# Patient Record
Sex: Male | Born: 1945 | Race: White | Hispanic: No | Marital: Married | Smoking: Former smoker
Health system: Southern US, Community
[De-identification: ages and names within clinical notes are randomized; demographics above are authoritative.]

## PROBLEM LIST (undated history)

## (undated) DIAGNOSIS — N400 Enlarged prostate without lower urinary tract symptoms: Secondary | ICD-10-CM

## (undated) DIAGNOSIS — N200 Calculus of kidney: Secondary | ICD-10-CM

## (undated) DIAGNOSIS — J189 Pneumonia, unspecified organism: Secondary | ICD-10-CM

## (undated) DIAGNOSIS — S32000A Wedge compression fracture of unspecified lumbar vertebra, initial encounter for closed fracture: Secondary | ICD-10-CM

## (undated) DIAGNOSIS — E785 Hyperlipidemia, unspecified: Secondary | ICD-10-CM

## (undated) DIAGNOSIS — K429 Umbilical hernia without obstruction or gangrene: Secondary | ICD-10-CM

## (undated) DIAGNOSIS — I639 Cerebral infarction, unspecified: Secondary | ICD-10-CM

## (undated) HISTORY — DX: Wedge compression fracture of unspecified lumbar vertebra, initial encounter for closed fracture: S32.000A

## (undated) HISTORY — DX: Pneumonia, unspecified organism: J18.9

## (undated) HISTORY — PX: EYE SURGERY: SHX253

## (undated) HISTORY — DX: Cerebral infarction, unspecified: I63.9

---

## 2003-03-17 HISTORY — PX: BACK SURGERY: SHX140

## 2003-07-30 ENCOUNTER — Ambulatory Visit (HOSPITAL_COMMUNITY): Admission: RE | Admit: 2003-07-30 | Discharge: 2003-07-31 | Payer: Self-pay | Admitting: Specialist

## 2003-08-15 ENCOUNTER — Encounter: Admission: RE | Admit: 2003-08-15 | Discharge: 2003-08-15 | Payer: Self-pay | Admitting: Specialist

## 2005-03-22 IMAGING — CR DG CHEST 2V
2 series · 2 of 2 positions shown · non-contrast
Comparison: none

CLINICAL DATA: Right sided chest pain.
 TWO VIEW CHEST
 No prior studies.

[view not recorded (1 of 2)]
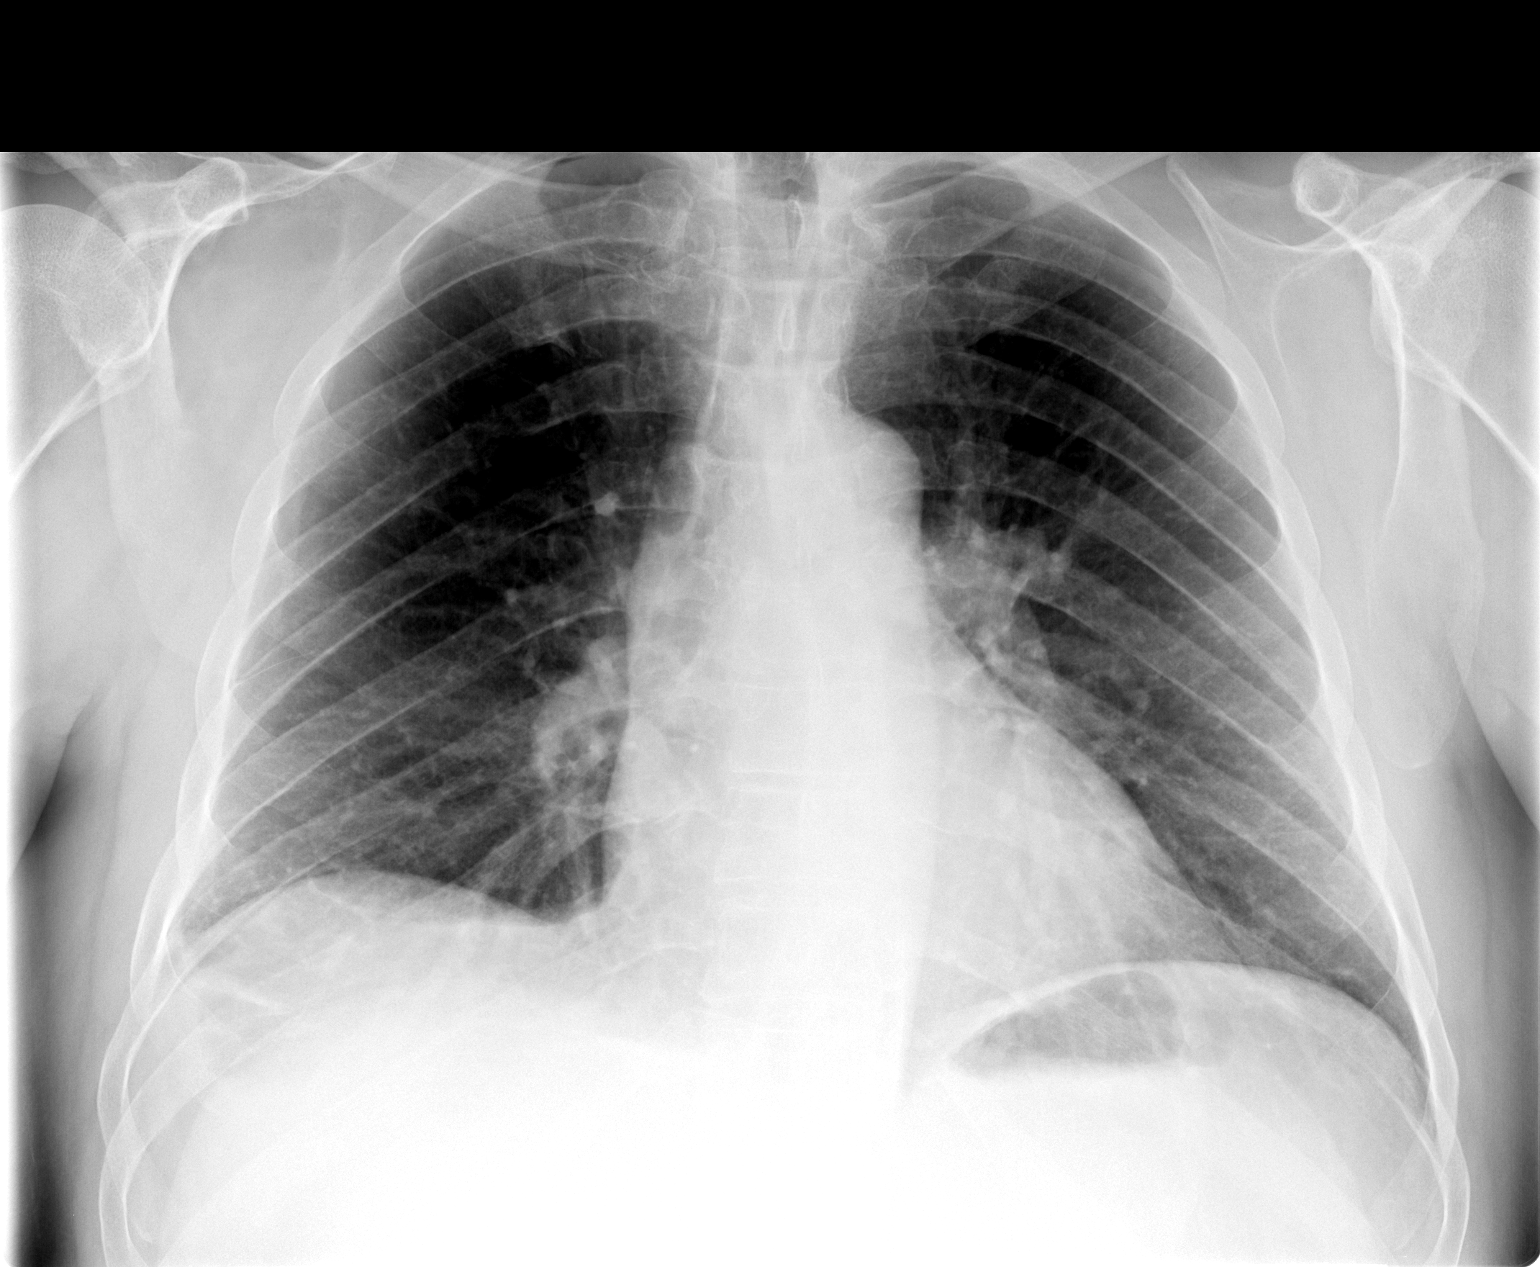

[view not recorded (2 of 2)]
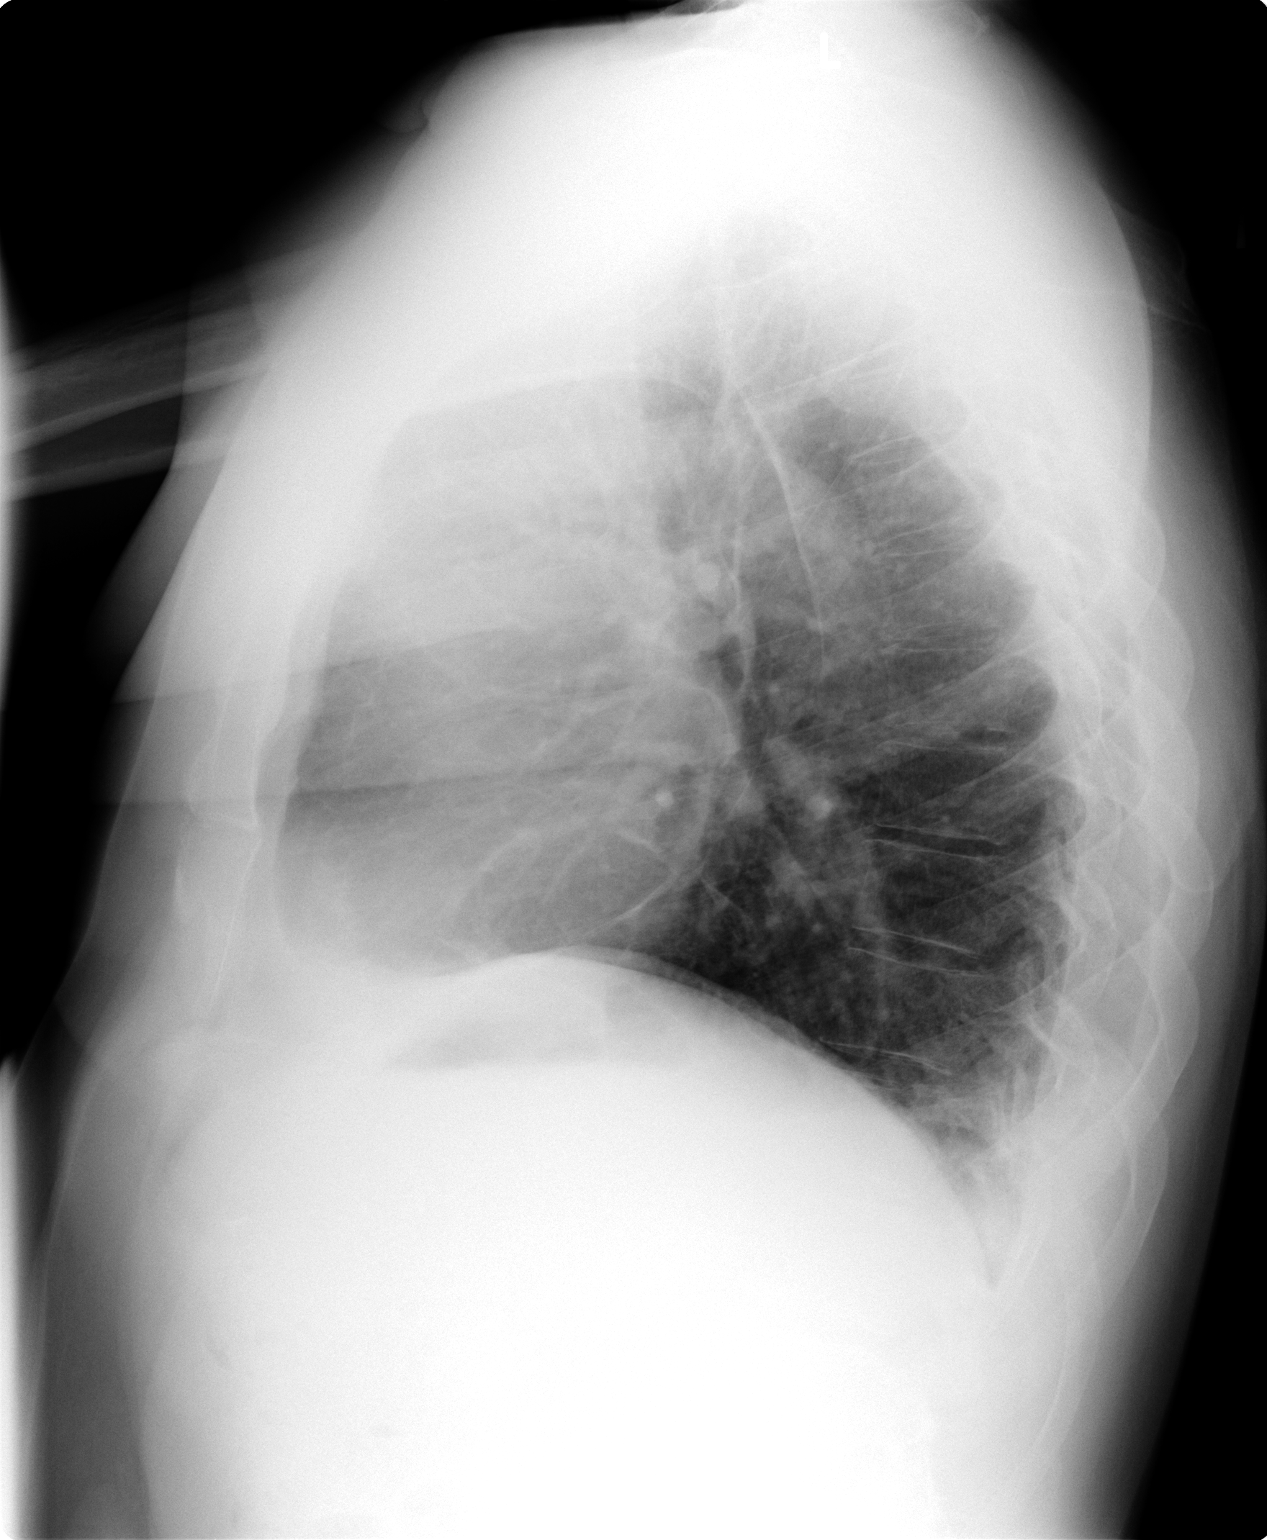

[2 of 2 positions shown; findings below may reference images not displayed]

There is some scarring versus atelectasis at the right lung base.  No edema, infiltrate or pleural effusion.  Heart size normal.  Soft tissue fold creates an additional line near the right lung apex which is not felt to represent pneumothorax.  The visualized bony thorax is unremarkable.  

 IMPRESSION

 Right basilar scarring versus atelectasis.  No active disease.

## 2012-04-14 ENCOUNTER — Encounter (HOSPITAL_COMMUNITY): Payer: Self-pay | Admitting: Pharmacy Technician

## 2012-04-14 ENCOUNTER — Encounter (HOSPITAL_COMMUNITY): Payer: Self-pay | Admitting: *Deleted

## 2012-04-14 NOTE — H&P (Signed)
  Troy Bentley is an 67 y.o. male.    Chief Complaint:  Left clavicle fracture  HPI: Pt is a 67 y.o. male complaining of left shoulder pain since a fall skiing 3 days ago. Diagnosed with clavicle fracture. Plan for ORIF and tightrope procedure for reduction  PCP:  No primary provider on file.  D/C Plans:Home   PMH: hyperlipidemia  PSH: No past surgical history on file.  Social History:  does not have a smoking history on file. He does not have any smokeless tobacco history on file. His alcohol and drug histories not on file.  Allergies:  NKDA  Medications: Pravastatin Aspirin vitamins  No results found for this or any previous visit (from the past 48 hour(s)). No results found.  ROS: Pain with rom of the left upper extremity  Physical Exam: Alert and oriented 67 y.o. male in no acute distress Cranial nerves 2-12 intact Cervical spine: full rom with no tenderness, nv intact distally Chest: active breath sounds bilaterally, no wheeze rhonchi or rales Heart: regular rate and rhythm, no murmur Abd: non tender non distended with active bowel sounds Hip is stable with rom  Left clavicle with obvious deformity distally nv intact distally No signs of open injury  Assessment/Plan Assessment: left clavicle fracture with displacement   Plan: Patient will undergo a left clavicle ORIF by Dr. Ranell Patrick at [facility]. Risks benefits and expectations were discussed with the patient. Patient understand risks, benefits and expectations and wishes to proceed.

## 2012-04-15 ENCOUNTER — Ambulatory Visit (HOSPITAL_COMMUNITY)
Admission: RE | Admit: 2012-04-15 | Discharge: 2012-04-15 | Disposition: A | Discharge status: Home / Self Care | Payer: Medicare Other | Source: Ambulatory Visit | Attending: Orthopedic Surgery | Admitting: Orthopedic Surgery

## 2012-04-15 ENCOUNTER — Encounter (HOSPITAL_COMMUNITY): Payer: Self-pay | Admitting: Anesthesiology

## 2012-04-15 ENCOUNTER — Ambulatory Visit (HOSPITAL_COMMUNITY): Payer: Medicare Other | Admitting: Anesthesiology

## 2012-04-15 ENCOUNTER — Ambulatory Visit (HOSPITAL_COMMUNITY): Payer: Medicare Other

## 2012-04-15 ENCOUNTER — Encounter (HOSPITAL_COMMUNITY)
Admission: RE | Disposition: A | Discharge status: Home / Self Care | Payer: Self-pay | Source: Ambulatory Visit | Attending: Orthopedic Surgery

## 2012-04-15 DIAGNOSIS — Y9323 Activity, snow (alpine) (downhill) skiing, snow boarding, sledding, tobogganing and snow tubing: Secondary | ICD-10-CM | POA: Insufficient documentation

## 2012-04-15 DIAGNOSIS — S42033A Displaced fracture of lateral end of unspecified clavicle, initial encounter for closed fracture: Secondary | ICD-10-CM | POA: Diagnosis present

## 2012-04-15 HISTORY — PX: ORIF CLAVICULAR FRACTURE: SHX5055

## 2012-04-15 HISTORY — DX: Benign prostatic hyperplasia without lower urinary tract symptoms: N40.0

## 2012-04-15 HISTORY — DX: Umbilical hernia without obstruction or gangrene: K42.9

## 2012-04-15 HISTORY — DX: Hyperlipidemia, unspecified: E78.5

## 2012-04-15 HISTORY — DX: Calculus of kidney: N20.0

## 2012-04-15 HISTORY — DX: Displaced fracture of lateral end of unspecified clavicle, initial encounter for closed fracture: S42.033A

## 2012-04-15 LAB — CBC
HCT: 42.9 % (ref 39.0–52.0)
Hemoglobin: 15 g/dL (ref 13.0–17.0)
MCH: 34 pg (ref 26.0–34.0)
MCHC: 35 g/dL (ref 30.0–36.0)
MCV: 97.3 fL (ref 78.0–100.0)
Platelets: 135 10*3/uL — ABNORMAL LOW (ref 150–400)
RBC: 4.41 MIL/uL (ref 4.22–5.81)
RDW: 13.3 % (ref 11.5–15.5)
WBC: 5.3 10*3/uL (ref 4.0–10.5)

## 2012-04-15 LAB — SURGICAL PCR SCREEN
MRSA, PCR: NEGATIVE
Staphylococcus aureus: NEGATIVE

## 2012-04-15 SURGERY — OPEN REDUCTION INTERNAL FIXATION (ORIF) CLAVICULAR FRACTURE
Anesthesia: Regional | Site: Shoulder | Laterality: Left | Wound class: Clean

## 2012-04-15 MED ORDER — BUPIVACAINE-EPINEPHRINE PF 0.5-1:200000 % IJ SOLN
INTRAMUSCULAR | Status: DC | PRN
  Administered 2012-04-15: 30 mL

## 2012-04-15 MED ORDER — MUPIROCIN 2 % EX OINT
TOPICAL_OINTMENT | CUTANEOUS | Status: AC
  Filled 2012-04-15: qty 22

## 2012-04-15 MED ORDER — ACETAMINOPHEN 10 MG/ML IV SOLN
INTRAVENOUS | Status: AC
  Administered 2012-04-15: 1000 mg via INTRAVENOUS
  Filled 2012-04-15: qty 100

## 2012-04-15 MED ORDER — GLYCOPYRROLATE 0.2 MG/ML IJ SOLN
INTRAMUSCULAR | Status: DC | PRN
  Administered 2012-04-15: 0.4 mg via INTRAVENOUS

## 2012-04-15 MED ORDER — BUPIVACAINE-EPINEPHRINE PF 0.25-1:200000 % IJ SOLN
INTRAMUSCULAR | Status: AC
  Filled 2012-04-15: qty 30

## 2012-04-15 MED ORDER — OXYCODONE-ACETAMINOPHEN 5-325 MG PO TABS: 1.0000 | ORAL_TABLET | ORAL | Status: DC | PRN

## 2012-04-15 MED ORDER — LACTATED RINGERS IV SOLN
INTRAVENOUS | Status: DC | PRN
  Administered 2012-04-15 (×2): via INTRAVENOUS

## 2012-04-15 MED ORDER — MEPERIDINE HCL 25 MG/ML IJ SOLN: 6.2500 mg | INTRAMUSCULAR | Status: DC | PRN

## 2012-04-15 MED ORDER — METHOCARBAMOL 500 MG PO TABS: 500.0000 mg | ORAL_TABLET | Freq: Three times a day (TID) | ORAL | Status: DC | PRN

## 2012-04-15 MED ORDER — MIDAZOLAM HCL 5 MG/5ML IJ SOLN
INTRAMUSCULAR | Status: DC | PRN
  Administered 2012-04-15: 2 mg via INTRAVENOUS

## 2012-04-15 MED ORDER — LIDOCAINE HCL (CARDIAC) 20 MG/ML IV SOLN
INTRAVENOUS | Status: DC | PRN
  Administered 2012-04-15: 50 mg via INTRAVENOUS

## 2012-04-15 MED ORDER — OXYCODONE HCL 5 MG PO TABS: 5.0000 mg | ORAL_TABLET | Freq: Once | ORAL | Status: DC | PRN

## 2012-04-15 MED ORDER — BUPIVACAINE-EPINEPHRINE 0.25% -1:200000 IJ SOLN
INTRAMUSCULAR | Status: DC | PRN
  Administered 2012-04-15: 30 mL

## 2012-04-15 MED ORDER — ACETAMINOPHEN 325 MG PO TABS
ORAL_TABLET | ORAL | Status: AC
  Filled 2012-04-15: qty 1

## 2012-04-15 MED ORDER — CEFAZOLIN SODIUM-DEXTROSE 2-3 GM-% IV SOLR
INTRAVENOUS | Status: AC
  Filled 2012-04-15: qty 50

## 2012-04-15 MED ORDER — MIDAZOLAM HCL 2 MG/2ML IJ SOLN: 0.5000 mg | Freq: Once | INTRAMUSCULAR | Status: DC | PRN

## 2012-04-15 MED ORDER — 0.9 % SODIUM CHLORIDE (POUR BTL) OPTIME
TOPICAL | Status: DC | PRN
  Administered 2012-04-15: 1000 mL

## 2012-04-15 MED ORDER — HYDROMORPHONE HCL PF 1 MG/ML IJ SOLN: 0.2500 mg | INTRAMUSCULAR | Status: DC | PRN

## 2012-04-15 MED ORDER — NEOSTIGMINE METHYLSULFATE 1 MG/ML IJ SOLN
INTRAMUSCULAR | Status: DC | PRN
  Administered 2012-04-15: 3 mg via INTRAVENOUS

## 2012-04-15 MED ORDER — CHLORHEXIDINE GLUCONATE 4 % EX LIQD: 60.0000 mL | Freq: Once | CUTANEOUS | Status: DC

## 2012-04-15 MED ORDER — LACTATED RINGERS IV SOLN
INTRAVENOUS | Status: DC
  Administered 2012-04-15: 12:00:00 via INTRAVENOUS

## 2012-04-15 MED ORDER — ROCURONIUM BROMIDE 100 MG/10ML IV SOLN
INTRAVENOUS | Status: DC | PRN
  Administered 2012-04-15: 40 mg via INTRAVENOUS

## 2012-04-15 MED ORDER — OXYCODONE HCL 5 MG/5ML PO SOLN: 5.0000 mg | Freq: Once | ORAL | Status: DC | PRN

## 2012-04-15 MED ORDER — ACETAMINOPHEN 10 MG/ML IV SOLN: 1000.0000 mg | Freq: Once | INTRAVENOUS | Status: DC

## 2012-04-15 MED ORDER — LIDOCAINE HCL 4 % MT SOLN
OROMUCOSAL | Status: DC | PRN
  Administered 2012-04-15: 4 mL via TOPICAL

## 2012-04-15 MED ORDER — FENTANYL CITRATE 0.05 MG/ML IJ SOLN
INTRAMUSCULAR | Status: DC | PRN
  Administered 2012-04-15 (×2): 50 ug via INTRAVENOUS
  Administered 2012-04-15: 100 ug via INTRAVENOUS
  Administered 2012-04-15: 50 ug via INTRAVENOUS

## 2012-04-15 MED ORDER — DEXTROSE 5 % IV SOLN
INTRAVENOUS | Status: DC | PRN
  Administered 2012-04-15 (×2): via INTRAVENOUS

## 2012-04-15 MED ORDER — PROPOFOL 10 MG/ML IV BOLUS
INTRAVENOUS | Status: DC | PRN
  Administered 2012-04-15: 200 mg via INTRAVENOUS

## 2012-04-15 MED ORDER — CEFAZOLIN SODIUM-DEXTROSE 2-3 GM-% IV SOLR
2.0000 g | INTRAVENOUS | Status: AC
  Administered 2012-04-15: 2 g via INTRAVENOUS

## 2012-04-15 MED ORDER — PROMETHAZINE HCL 25 MG/ML IJ SOLN: 6.2500 mg | INTRAMUSCULAR | Status: DC | PRN

## 2012-04-15 MED ORDER — PHENYLEPHRINE HCL 10 MG/ML IJ SOLN
10.0000 mg | INTRAVENOUS | Status: DC | PRN
  Administered 2012-04-15: 50 ug/min via INTRAVENOUS

## 2012-04-15 MED ORDER — MUPIROCIN 2 % EX OINT
TOPICAL_OINTMENT | Freq: Two times a day (BID) | CUTANEOUS | Status: DC
  Administered 2012-04-15: 1 via NASAL

## 2012-04-15 MED ORDER — ARTIFICIAL TEARS OP OINT
TOPICAL_OINTMENT | OPHTHALMIC | Status: DC | PRN
  Administered 2012-04-15: 1 via OPHTHALMIC

## 2012-04-15 MED ORDER — ONDANSETRON HCL 4 MG/2ML IJ SOLN
INTRAMUSCULAR | Status: DC | PRN
  Administered 2012-04-15: 4 mg via INTRAVENOUS

## 2012-04-15 SURGICAL SUPPLY — 61 items
2.7 mm locking screw 18mm (Screw) ×2 IMPLANT
3.5 mm locking screw 20mm (Screw) ×6 IMPLANT
3.5 mm locking screw 22mm (Screw) ×2 IMPLANT
BIT DRILL 2 CANN GRADUATED (BIT) ×2 IMPLANT
BIT DRILL 2.5 CANN ENDOSCOPIC (BIT) ×2 IMPLANT
BIT DRILL Q COUPLING 4.5 (BIT) IMPLANT
BIT DRILL Q/COUPLING 1 (BIT) IMPLANT
CLEANER TIP ELECTROSURG 2X2 (MISCELLANEOUS) ×2 IMPLANT
CLOTH BEACON ORANGE TIMEOUT ST (SAFETY) ×2 IMPLANT
DRAPE C-ARM 42X72 X-RAY (DRAPES) ×2 IMPLANT
DRAPE INCISE IOBAN 66X45 STRL (DRAPES) ×2 IMPLANT
DRAPE U-SHAPE 47X51 STRL (DRAPES) ×2 IMPLANT
DRSG EMULSION OIL 3X3 NADH (GAUZE/BANDAGES/DRESSINGS) ×2 IMPLANT
Distal clavicle button, coracoid fixation (Shoulder) ×2 IMPLANT
Distal third left plate, 8 hole plate (Shoulder) ×2 IMPLANT
ELECT BLADE 4.0 EZ CLEAN MEGAD (MISCELLANEOUS) ×2
ELECT NEEDLE TIP 2.8 STRL (NEEDLE) ×4 IMPLANT
ELECT REM PT RETURN 9FT ADLT (ELECTROSURGICAL) ×2
ELECTRODE BLDE 4.0 EZ CLN MEGD (MISCELLANEOUS) ×1 IMPLANT
ELECTRODE REM PT RTRN 9FT ADLT (ELECTROSURGICAL) ×1 IMPLANT
FIBERSTICK 2 (SUTURE) ×2 IMPLANT
GLOVE BIOGEL PI ORTHO PRO 7.5 (GLOVE) ×1
GLOVE BIOGEL PI ORTHO PRO SZ8 (GLOVE) ×1
GLOVE ORTHO TXT STRL SZ7.5 (GLOVE) ×2 IMPLANT
GLOVE PI ORTHO PRO STRL 7.5 (GLOVE) ×1 IMPLANT
GLOVE PI ORTHO PRO STRL SZ8 (GLOVE) ×1 IMPLANT
GLOVE SURG ORTHO 8.5 STRL (GLOVE) ×2 IMPLANT
GOWN STRL NON-REIN LRG LVL3 (GOWN DISPOSABLE) ×4 IMPLANT
GOWN STRL REIN XL XLG (GOWN DISPOSABLE) ×4 IMPLANT
KIT BASIN OR (CUSTOM PROCEDURE TRAY) ×2 IMPLANT
KIT REPAIR AC JOINT TIGHT (Orthopedic Implant) ×2 IMPLANT
KIT ROOM TURNOVER OR (KITS) ×2 IMPLANT
MANIFOLD NEPTUNE II (INSTRUMENTS) ×2 IMPLANT
NEEDLE 22X1 1/2 (OR ONLY) (NEEDLE) IMPLANT
NEEDLE HYPO 25GX1X1/2 BEV (NEEDLE) ×2 IMPLANT
NS IRRIG 1000ML POUR BTL (IV SOLUTION) ×2 IMPLANT
PACK SHOULDER (CUSTOM PROCEDURE TRAY) ×2 IMPLANT
PAD ARMBOARD 7.5X6 YLW CONV (MISCELLANEOUS) ×4 IMPLANT
SCREW LOCK T15 FT 18X3.5XST (Screw) ×2 IMPLANT
SCREW LOCKING 2.7X16MM (Screw) ×6 IMPLANT
SCREW LOCKING 3.5X18 (Screw) ×2 IMPLANT
SLING ARM FOAM STRAP LRG (SOFTGOODS) ×2 IMPLANT
SLING ARM IMMOBILIZER LRG (SOFTGOODS) ×2 IMPLANT
SPONGE GAUZE 4X4 12PLY (GAUZE/BANDAGES/DRESSINGS) ×2 IMPLANT
SPONGE LAP 4X18 X RAY DECT (DISPOSABLE) ×4 IMPLANT
STRIP CLOSURE SKIN 1/2X4 (GAUZE/BANDAGES/DRESSINGS) ×4 IMPLANT
SUCTION FRAZIER TIP 10 FR DISP (SUCTIONS) ×2 IMPLANT
SUT MNCRL AB 4-0 PS2 18 (SUTURE) ×2 IMPLANT
SUT VIC AB 0 CT1 27 (SUTURE) ×2
SUT VIC AB 0 CT1 27XBRD ANBCTR (SUTURE) ×2 IMPLANT
SUT VIC AB 1 CT1 27 (SUTURE) ×1
SUT VIC AB 1 CT1 27XBRD ANBCTR (SUTURE) ×1 IMPLANT
SUT VIC AB 2-0 CT1 27 (SUTURE) ×1
SUT VIC AB 2-0 CT1 TAPERPNT 27 (SUTURE) ×1 IMPLANT
SUT VICRYL 0 CT 1 36IN (SUTURE) ×2 IMPLANT
SYR CONTROL 10ML LL (SYRINGE) ×2 IMPLANT
TAPE CLOTH SURG 4X10 WHT LF (GAUZE/BANDAGES/DRESSINGS) ×2 IMPLANT
TAPE STRIPS DRAPE STRL (GAUZE/BANDAGES/DRESSINGS) ×2 IMPLANT
TOWEL OR 17X24 6PK STRL BLUE (TOWEL DISPOSABLE) ×2 IMPLANT
TOWEL OR 17X26 10 PK STRL BLUE (TOWEL DISPOSABLE) ×2 IMPLANT
WATER STERILE IRR 1000ML POUR (IV SOLUTION) ×2 IMPLANT

## 2012-04-15 NOTE — Interval H&P Note (Signed)
History and Physical Interval Note:  04/15/2012 11:47 AM  Troy Bentley  has presented today for surgery, with the diagnosis of LEFT CLAVICLE FRACTURE  The various methods of treatment have been discussed with the patient and family. After consideration of risks, benefits and other options for treatment, the patient has consented to  Procedure(s) (LRB) with comments: OPEN REDUCTION INTERNAL FIXATION (ORIF) CLAVICULAR FRACTURE (Left) as a surgical intervention .  The patient's history has been reviewed, patient examined, no change in status, stable for surgery.  I have reviewed the patient's chart and labs.  Questions were answered to the patient's satisfaction.     Leanza Shepperson,STEVEN R

## 2012-04-15 NOTE — Brief Op Note (Signed)
04/15/2012  3:00 PM  PATIENT:  Kemper Durie  67 y.o. male  PRE-OPERATIVE DIAGNOSIS:  LEFT CLAVICLE FRACTURE, distal with displacement  POST-OPERATIVE DIAGNOSIS:  LEFT CLAVICLE FRACTURE,distal with displacement  PROCEDURE:  Procedure(s) (LRB) with comments: OPEN REDUCTION INTERNAL FIXATION (ORIF) CLAVICULAR FRACTURE (Left), use of combination plate and coraco-clavicular fixation(Arthrex TightRope system)  SURGEON:  Surgeon(s) and Role:    * Verlee Rossetti, MD - Primary  PHYSICIAN ASSISTANT:   ASSISTANTS: Thea Gist, PA-C   ANESTHESIA:   regional and general  EBL:  Total I/O In: 1900 [I.V.:1900] Out: 50 [Blood:50]  BLOOD ADMINISTERED:none  DRAINS: none   LOCAL MEDICATIONS USED:  MARCAINE     SPECIMEN:  No Specimen  DISPOSITION OF SPECIMEN:  N/A  COUNTS:  YES  TOURNIQUET:  * No tourniquets in log *  DICTATION: .Other Dictation: Dictation Number [id number]  PLAN OF CARE: Discharge to home after PACU  PATIENT DISPOSITION:  PACU - hemodynamically stable.   Delay start of Pharmacological VTE agent (>24hrs) due to surgical blood loss or risk of bleeding: not applicable

## 2012-04-15 NOTE — Anesthesia Preprocedure Evaluation (Addendum)
Anesthesia Evaluation  Patient identified by MRN, date of birth, ID band Patient awake    Reviewed: Allergy & Precautions, H&P , NPO status , Patient's Chart, lab work & pertinent test results, reviewed documented beta blocker date and time   History of Anesthesia Complications Negative for: history of anesthetic complications  Airway Mallampati: II TM Distance: >3 FB Neck ROM: Full    Dental  (+) Teeth Intact and Dental Advisory Given   Pulmonary neg pulmonary ROS, former smoker,  breath sounds clear to auscultation  Pulmonary exam normal       Cardiovascular Exercise Tolerance: Good negative cardio ROS  Rhythm:Regular Rate:Normal  15-Apr-2012 10:39:25 [facility] ROUTINE RECORD Normal sinus rhythm Normal ECG   Neuro/Psych negative neurological ROS  negative psych ROS   GI/Hepatic negative GI ROS, Neg liver ROS,   Endo/Other  negative endocrine ROS  Renal/GU Renal diseasenegative Renal ROSRenal Calculus HX     Musculoskeletal negative musculoskeletal ROS (+) Arthritis -, Osteoarthritis,    Abdominal   Peds  Hematology negative hematology ROS (+)   Anesthesia Other Findings Skiing Accident 04-11-12 Pt wearing sling.  Crowded mouth/ Teeth intact.  Larynx anterior.   Reproductive/Obstetrics negative OB ROS                        Anesthesia Physical Anesthesia Plan  ASA: II  Anesthesia Plan: General   Post-op Pain Management:    Induction: Intravenous  Airway Management Planned: Oral ETT  Additional Equipment:   Intra-op Plan:   Post-operative Plan: Extubation in OR  Informed Consent: I have reviewed the patients History and Physical, chart, labs and discussed the procedure including the risks, benefits and alternatives for the proposed anesthesia with the patient or authorized representative who has indicated his/her understanding and acceptance.   Dental  advisory given  Plan Discussed with: CRNA and Surgeon  Anesthesia Plan Comments: (Plan routine monitors, GETA with interscalene block for post op analgesia)       Anesthesia Quick Evaluation

## 2012-04-15 NOTE — Op Note (Signed)
NAMEWILIAN, Troy Bentley NO.:  [id number]  MEDICAL RECORD NO.:  [id number]  LOCATION:  [facility]                         FACILITY:  [facility]  PHYSICIAN:  Almedia Balls. Ranell Patrick, M.D. DATE OF BIRTH:  07/20/1945  DATE OF PROCEDURE:  04/15/2012 DATE OF DISCHARGE:  04/15/2012                              OPERATIVE REPORT   PREOPERATIVE DIAGNOSIS:  Left displaced clavicle fracture.  POSTOPERATIVE DIAGNOSIS:  Left displaced clavicle fracture (distal end).  PROCEDURE PERFORMED:  Left shoulder open reduction and internal fixation of distal clavicle fracture with coracoclavicular fixation and augmentation using Arthrex tight rope system.  ATTENDING SURGEON:  Almedia Balls. Ranell Patrick, MD  ASSISTANT:  Donnie Coffin. Dixon, PA-C, who scrubbed the entire procedure necessary for satisfactory completion for surgery.  General anesthesia was used plus interscalene block.  ESTIMATED BLOOD LOSS:  Minimal.  FLUID REPLACEMENT:  1000 mL crystalloid.  INSTRUMENT COUNTS:  Correct.  COMPLICATIONS:  There were no complications.  ANTIBIOTICS:  Perioperative antibiotics were given.  INDICATIONS:  The patient is a 67 year old male who suffered a fall while skiing up in [place].  The patient presented with an obviously displaced distal clavicle fracture.  The patient's x-ray show a comminuted fracture about the coracoclavicular area with significant elevation greater than 300% of the medial fragment.  The lateral fragment is comminuted and still attached to the [facility] ligaments and CC ligaments.  Due to the extreme displacement of fracture and high likelihood for nonunion, I discussed with the patient options for management to include surgery.  The patient elected to proceed with surgery.  Informed consent was obtained.  DESCRIPTION OF PROCEDURE:  After adequate level of anesthesia was achieved, the patient was positioned in a modified beach-chair position. Left shoulder correctly identified.  Sterile prep  and drape performed. Time-out called.  Longitudinal skin incision created over the distal clavicle.  Dissection down through subcutaneous tissues using the needle- tip Bovie, identified the platysma and trapezius fascia and divided that overlying the clavicle fractured area.  We identified several loose pieces of clavicle.  One was basically floated out of the fractured area and we kept that for bone graft at the end.  There was another big piece that was seen to be the posterior aspect of the clavicle that was still attached to the trapezius.  We left that to attached muscle.  We next went ahead and identified a distal clavicle plate which was contoured, stainless steel plate from Arthrex, designed to incorporate a coracoid clavicular augmentation that sits down into one of the screw holes.  We identified the proper length plate.  We reduced the fracture, placed the plate in proper position, anchored it with 1 bicortical screw on the medial fragment.  Once we were happy with that, we put in a 2nd screw to hold the plate position.  With the fracture reduced and the plate in place, we had good coverage over the top of the distal fragment and good alignment of the fracture.  We next went ahead and made our approach to the coracoid process through a just mini deltopectoral incision. Dissection down through subcutaneous tissues.  Deltopectoral interval identified and finger dissection identified the dorsal aspect, proximal aspect of  the coracoid base.  We used Bovie to clean out the soft tissue there.  We then went ahead and made a small longitudinal incision in the proximal portion of the conjoined tendon.  I was able to slip my finger up under there and Crego elevator to protect the neurovascular structures beneath the coracoid.  Next we went ahead and drilled the guide pin, center of the base of the coracoid.  We then over drilled with a 4 mm drill bit for the tight rope system by Arthrex.   We then went ahead and passed our sutures through the plate down through the coracoid base and then passed our tight rope system with the Endo button like distal tight rope metal pin through the coracoid base and flipping it on the underside of the coracoid.  I could palpate it. It was turned transverse and nice fixation.  We next unloaded the button that comes in tight rope system.  We then placed the sutures through the little metal insert that goes inside the plate and then we reduced the fracture, put the plate in good position over the distal fragment and then went ahead and tied down those #5 FiberWire sutures.  A #2 FiberWire sutures thus were then secured and basically this fracture was completely reduced, placed our remaining bicortical sutures medially and then locking screws x4 laterally with good purchase.  We had excellent reduction, got our final x-rays, irrigated all wounds.  We then went ahead and took large piece of bone that goes to the posterior aspect of clavicle, put that in anatomic position, placed sutures in the cerclage fashion around that thus reanchoring the posterior aspect of the trapezius to that portion of clavicle.  We were pleased with that reduction.  Final x-rays were obtained.  Thoroughly irrigated all wounds and closed the deep layer, muscular layer with 0 Vicryl suture followed by 2-0 Vicryl subcutaneous closure and 4-0 Monocryl for skin.  We did use a generous amount of local in that area.  The patient tolerated the procedure well, was awakened and taken to recovery room.     Almedia Balls. Ranell Patrick, M.D.     SRN/MEDQ  D:  04/15/2012  T:  04/15/2012  Job:  [id number]

## 2012-04-15 NOTE — Anesthesia Procedure Notes (Addendum)
Anesthesia Regional Block:  Interscalene brachial plexus block  Pre-Anesthetic Checklist: ,, timeout performed, Correct Patient, Correct Site, Correct Laterality, Correct Procedure, Correct Position, site marked, Risks and benefits discussed,  Surgical consent,  Pre-op evaluation,  At surgeon's request and post-op pain management  Laterality: Left  Prep: chloraprep       Needles:  Injection technique: Single-shot  Needle Type: Stimulator Needle - 40     Needle Length: 4cm  Needle Gauge: 22 and 22 G    Additional Needles: Interscalene brachial plexus block  Nerve Stimulator or Paresthesia:  Response: forearm twitch, 0.45 mA, 0.1 ms,   Additional Responses:   Narrative:  Start time: 04/15/2012 11:53 AM End time: 04/15/2012 11:59 AM Injection made incrementally with aspirations every 5 mL.  Events: injection painful  Performed by: Personally  Anesthesiologist: Sandford Craze, MD  Additional Notes: Pt identified in Holding room.  Monitors applied. Working IV access confirmed. Sterile prep L neck.  #22ga PNS to forearm twitch at 0.73mA threshold.  30cc 0.5% Bupivacaine with 1:200k epi injected incrementally after negative test dose.  Patient asymptomatic, VSS, no heme aspirated, tolerated well.   Sandford Craze, MD  Interscalene brachial plexus block Procedure Name: Intubation Date/Time: 04/15/2012 12:30 PM Performed by: Tyrone Nine Pre-anesthesia Checklist: Emergency Drugs available, Patient identified, Timeout performed, Suction available and Patient being monitored Patient Re-evaluated:Patient Re-evaluated prior to inductionOxygen Delivery Method: Circle system utilized Preoxygenation: Pre-oxygenation with 100% oxygen Intubation Type: IV induction Ventilation: Mask ventilation with difficulty and Oral airway inserted - appropriate to patient size Laryngoscope Size: Mac and 3 Grade View: Grade II Tube size: 7.5 mm Number of attempts: 1 Airway Equipment and Method:  Stylet Placement Confirmation: ETT inserted through vocal cords under direct vision,  positive ETCO2,  CO2 detector and breath sounds checked- equal and bilateral Secured at: 23 cm Tube secured with: Tape Dental Injury: Teeth and Oropharynx as per pre-operative assessment

## 2012-04-15 NOTE — Progress Notes (Signed)
After removal of IV, patient became nauseated....vomited very little......Troy Kitchenwas pale in color and hands were slightly clammy.Troy KitchenMarland KitchenMarland KitchenWas going to insert another IV, but after sitting awhile, his color pinked up...nausea is slowing dissipating. I have moved him over to Section B--and have given report to  [place], Charity fundraiser.... Discharge instructions given....cool cloth to forehead and ice pack to back of neck.........his nausea is passing. DA

## 2012-04-15 NOTE — Progress Notes (Signed)
Orthopedic Tech Progress Note Patient Details:  Troy Bentley 01/08/1946 [id number] Shoulder abduction pillow delivered to OR reception desk and taken back to patient's OR by unit nurse.  Ortho Devices Type of Ortho Device: Shoulder abduction pillow Ortho Device/Splint Interventions: Ordered   [place] R Thompson 04/15/2012, 3:08 PM

## 2012-04-15 NOTE — Preoperative (Signed)
Beta Blockers   Reason not to administer Beta Blockers:Not Applicable 

## 2012-04-15 NOTE — Transfer of Care (Signed)
Immediate Anesthesia Transfer of Care Note  Patient: Troy Bentley  Procedure(s) Performed: Procedure(s) (LRB) with comments: OPEN REDUCTION INTERNAL FIXATION (ORIF) CLAVICULAR FRACTURE (Left)  Patient Location: PACU  Anesthesia Type:General  Level of Consciousness: awake, alert , oriented and patient cooperative  Airway & Oxygen Therapy: Patient Spontanous Breathing and Patient connected to nasal cannula oxygen  Post-op Assessment: Report given to PACU RN and Post -op Vital signs reviewed and stable  Post vital signs: Reviewed and stable  Complications: No apparent anesthesia complications

## 2012-04-15 NOTE — Anesthesia Postprocedure Evaluation (Signed)
  Anesthesia Post-op Note  Patient: Troy Bentley  Procedure(s) Performed: Procedure(s) (LRB) with comments: OPEN REDUCTION INTERNAL FIXATION (ORIF) CLAVICULAR FRACTURE (Left)  Patient Location: PACU  Anesthesia Type:General  Level of Consciousness: awake, oriented and patient cooperative  Airway and Oxygen Therapy: Patient Spontanous Breathing  Post-op Pain: mild  Post-op Assessment: Post-op Vital signs reviewed, Patient's Cardiovascular Status Stable, Respiratory Function Stable, Patent Airway, No signs of Nausea or vomiting and Pain level controlled  Post-op Vital Signs: stable  Complications: No apparent anesthesia complications

## 2012-04-18 ENCOUNTER — Encounter (HOSPITAL_COMMUNITY): Payer: Self-pay | Admitting: Orthopedic Surgery

## 2013-11-21 IMAGING — RF DG CLAVICLE*L*
1 series · 4 of 4 positions shown · non-contrast
Comparison: None.

CLINICAL DATA: Fracture

DG C-ARM 61-120 MIN,LEFT CLAVICLE - 2+ VIEWS

[Series 1: run · 4 of 4 slices shown]
[im 1/4]
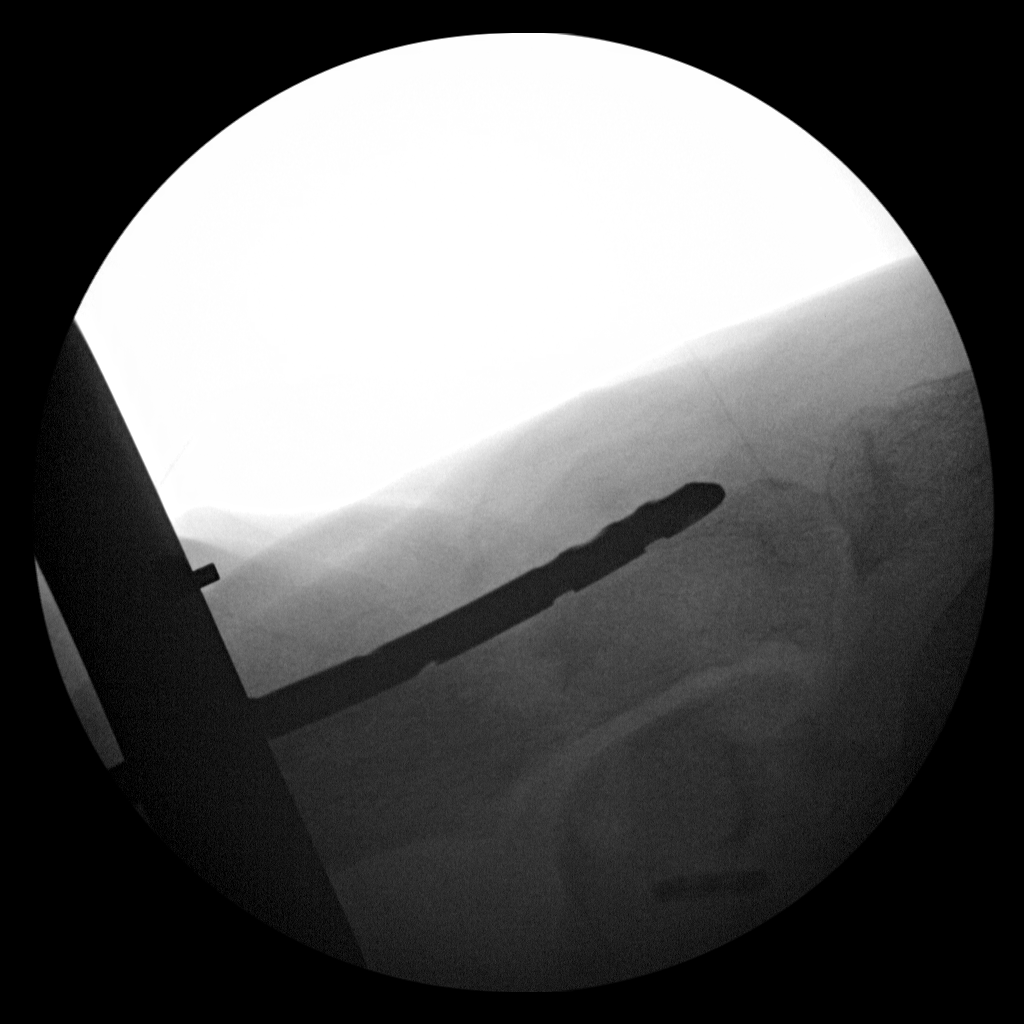
[im 2/4]
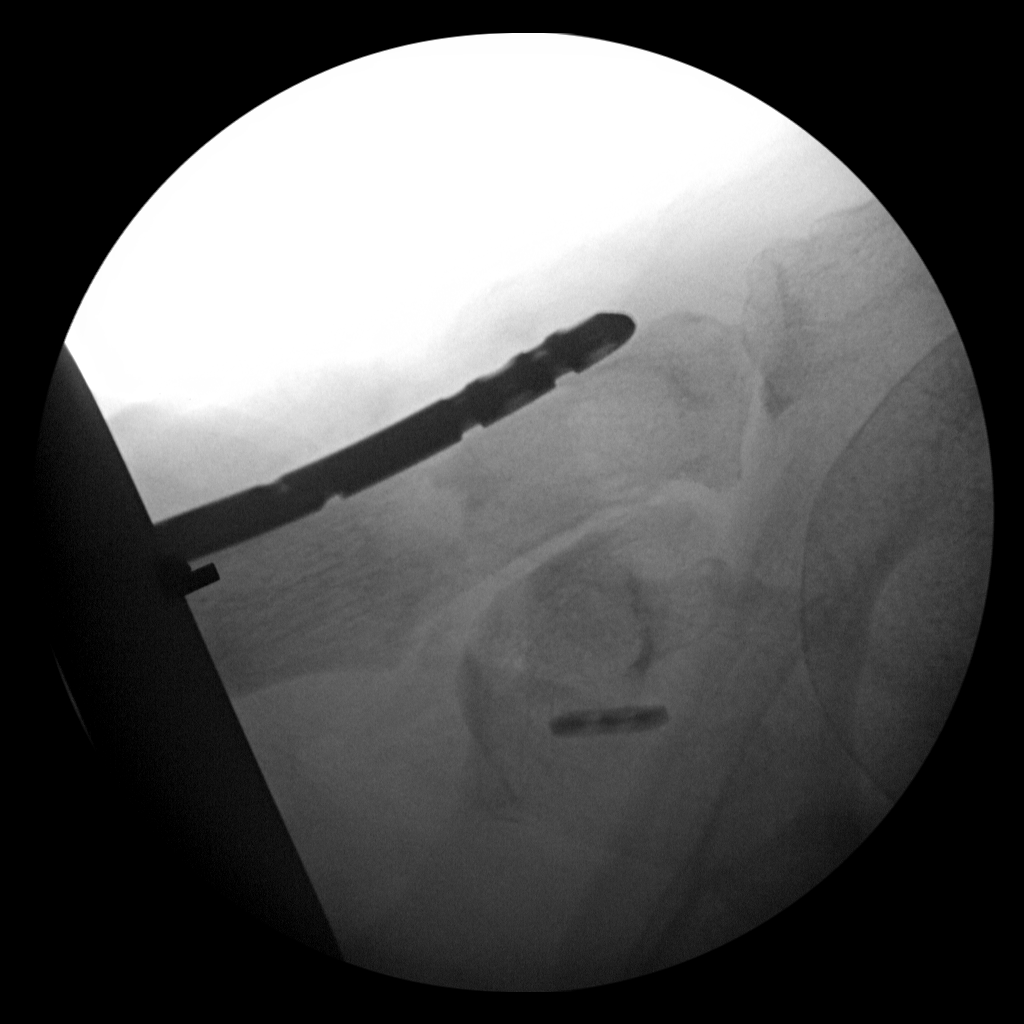
[im 3/4]
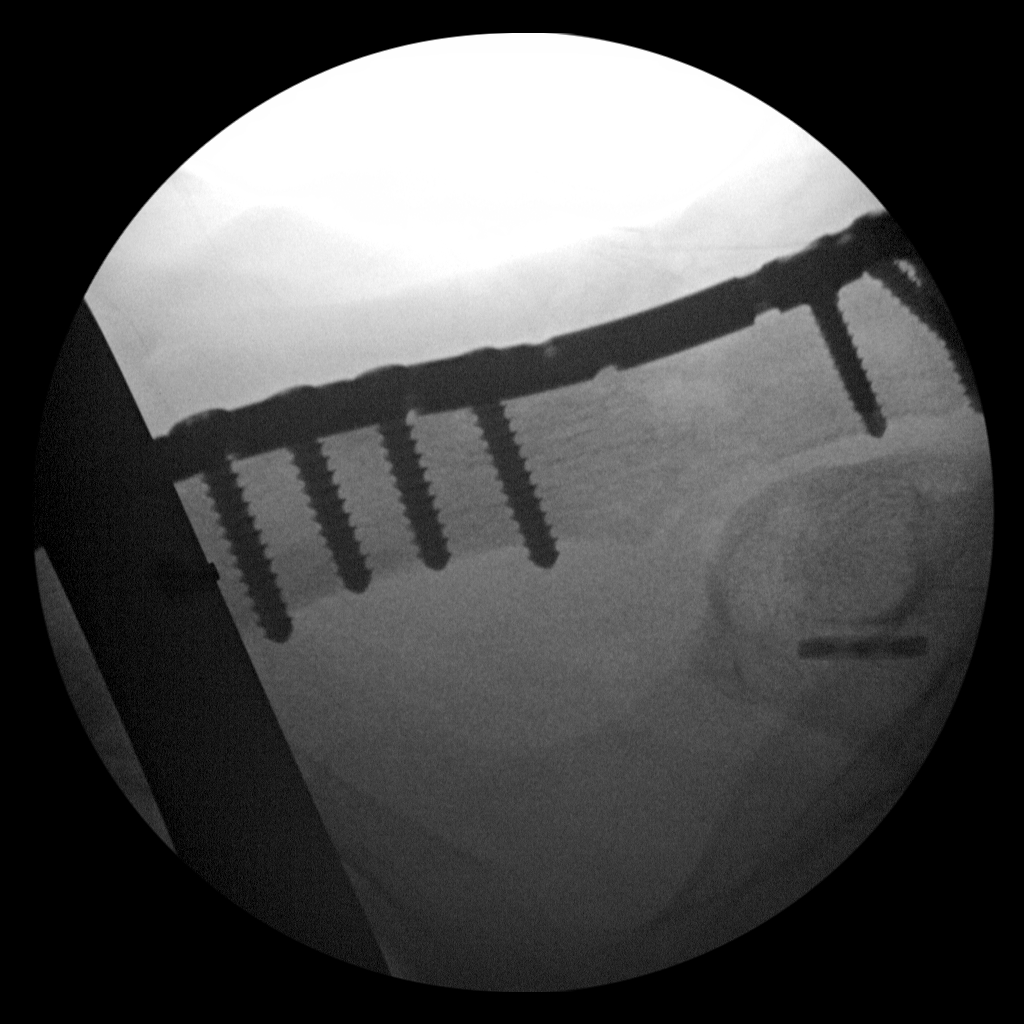
[im 4/4]
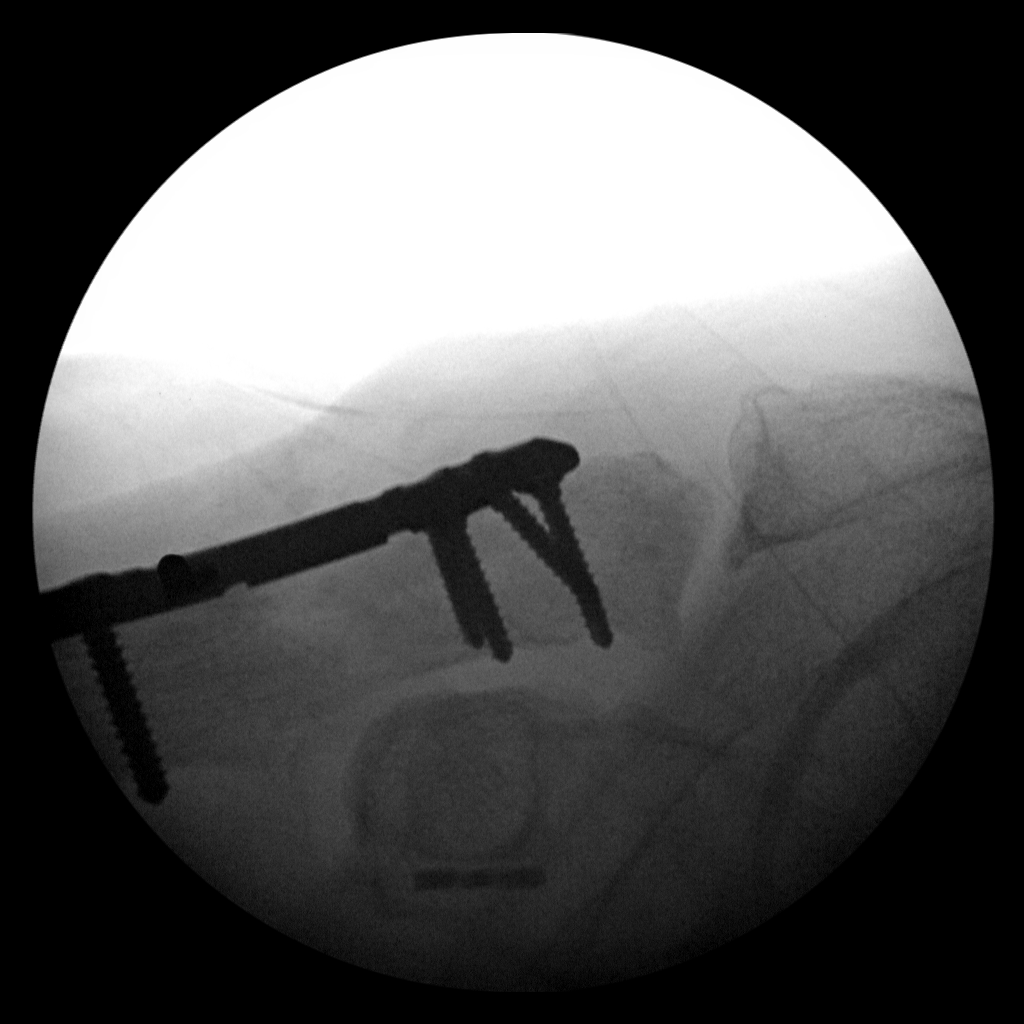

[4 of 4 positions shown; findings below may reference images not displayed]

FINDINGS: C-arm films document open reduction and internal fixation
of clavicle fracture.  Improved position and alignment.
IMPRESSION: As above peri

## 2015-05-01 DIAGNOSIS — E669 Obesity, unspecified: Secondary | ICD-10-CM | POA: Insufficient documentation

## 2015-05-01 DIAGNOSIS — N529 Male erectile dysfunction, unspecified: Secondary | ICD-10-CM

## 2015-05-01 DIAGNOSIS — N200 Calculus of kidney: Secondary | ICD-10-CM | POA: Insufficient documentation

## 2015-05-01 DIAGNOSIS — I861 Scrotal varices: Secondary | ICD-10-CM

## 2015-05-01 DIAGNOSIS — D696 Thrombocytopenia, unspecified: Secondary | ICD-10-CM

## 2015-05-01 DIAGNOSIS — L570 Actinic keratosis: Secondary | ICD-10-CM | POA: Insufficient documentation

## 2015-05-01 DIAGNOSIS — N400 Enlarged prostate without lower urinary tract symptoms: Secondary | ICD-10-CM | POA: Insufficient documentation

## 2015-05-01 DIAGNOSIS — K449 Diaphragmatic hernia without obstruction or gangrene: Secondary | ICD-10-CM | POA: Insufficient documentation

## 2015-05-01 DIAGNOSIS — R7301 Impaired fasting glucose: Secondary | ICD-10-CM

## 2015-05-01 DIAGNOSIS — L719 Rosacea, unspecified: Secondary | ICD-10-CM

## 2015-05-01 DIAGNOSIS — E785 Hyperlipidemia, unspecified: Secondary | ICD-10-CM

## 2015-05-01 DIAGNOSIS — D1800 Hemangioma unspecified site: Secondary | ICD-10-CM

## 2015-05-01 DIAGNOSIS — D126 Benign neoplasm of colon, unspecified: Secondary | ICD-10-CM

## 2015-05-01 HISTORY — DX: Scrotal varices: I86.1

## 2015-05-01 HISTORY — DX: Rosacea, unspecified: L71.9

## 2015-05-01 HISTORY — DX: Benign neoplasm of colon, unspecified: D12.6

## 2015-05-01 HISTORY — DX: Obesity, unspecified: E66.9

## 2015-05-01 HISTORY — DX: Hyperlipidemia, unspecified: E78.5

## 2015-05-01 HISTORY — DX: Male erectile dysfunction, unspecified: N52.9

## 2015-05-01 HISTORY — DX: Hemangioma unspecified site: D18.00

## 2015-05-01 HISTORY — DX: Thrombocytopenia, unspecified: D69.6

## 2015-05-01 HISTORY — DX: Diaphragmatic hernia without obstruction or gangrene: K44.9

## 2015-05-01 HISTORY — DX: Actinic keratosis: L57.0

## 2015-05-01 HISTORY — DX: Impaired fasting glucose: R73.01

## 2015-05-02 DIAGNOSIS — E785 Hyperlipidemia, unspecified: Secondary | ICD-10-CM | POA: Diagnosis not present

## 2015-05-02 DIAGNOSIS — B351 Tinea unguium: Secondary | ICD-10-CM | POA: Diagnosis not present

## 2015-05-02 DIAGNOSIS — M79645 Pain in left finger(s): Secondary | ICD-10-CM | POA: Diagnosis not present

## 2015-05-27 DIAGNOSIS — R972 Elevated prostate specific antigen [PSA]: Secondary | ICD-10-CM | POA: Diagnosis not present

## 2015-05-27 DIAGNOSIS — N401 Enlarged prostate with lower urinary tract symptoms: Secondary | ICD-10-CM | POA: Diagnosis not present

## 2015-06-06 DIAGNOSIS — H26493 Other secondary cataract, bilateral: Secondary | ICD-10-CM | POA: Diagnosis not present

## 2015-07-19 DIAGNOSIS — R7301 Impaired fasting glucose: Secondary | ICD-10-CM | POA: Diagnosis not present

## 2015-07-19 DIAGNOSIS — J029 Acute pharyngitis, unspecified: Secondary | ICD-10-CM | POA: Diagnosis not present

## 2015-07-19 DIAGNOSIS — M722 Plantar fascial fibromatosis: Secondary | ICD-10-CM | POA: Diagnosis not present

## 2015-09-12 DIAGNOSIS — H10011 Acute follicular conjunctivitis, right eye: Secondary | ICD-10-CM | POA: Diagnosis not present

## 2015-09-18 DIAGNOSIS — H10011 Acute follicular conjunctivitis, right eye: Secondary | ICD-10-CM | POA: Diagnosis not present

## 2015-10-17 DIAGNOSIS — H10011 Acute follicular conjunctivitis, right eye: Secondary | ICD-10-CM | POA: Diagnosis not present

## 2015-11-25 DIAGNOSIS — H5201 Hypermetropia, right eye: Secondary | ICD-10-CM | POA: Diagnosis not present

## 2015-11-25 DIAGNOSIS — H26493 Other secondary cataract, bilateral: Secondary | ICD-10-CM | POA: Diagnosis not present

## 2015-11-26 DIAGNOSIS — N200 Calculus of kidney: Secondary | ICD-10-CM | POA: Diagnosis not present

## 2015-11-26 DIAGNOSIS — R972 Elevated prostate specific antigen [PSA]: Secondary | ICD-10-CM | POA: Diagnosis not present

## 2015-11-26 DIAGNOSIS — Z7689 Persons encountering health services in other specified circumstances: Secondary | ICD-10-CM | POA: Diagnosis not present

## 2015-11-26 DIAGNOSIS — N401 Enlarged prostate with lower urinary tract symptoms: Secondary | ICD-10-CM | POA: Diagnosis not present

## 2015-11-28 DIAGNOSIS — B351 Tinea unguium: Secondary | ICD-10-CM | POA: Diagnosis not present

## 2015-11-28 DIAGNOSIS — D696 Thrombocytopenia, unspecified: Secondary | ICD-10-CM | POA: Diagnosis not present

## 2015-11-28 DIAGNOSIS — R7301 Impaired fasting glucose: Secondary | ICD-10-CM | POA: Diagnosis not present

## 2015-11-28 DIAGNOSIS — E785 Hyperlipidemia, unspecified: Secondary | ICD-10-CM | POA: Diagnosis not present

## 2015-11-28 DIAGNOSIS — L0292 Furuncle, unspecified: Secondary | ICD-10-CM | POA: Diagnosis not present

## 2016-01-06 DIAGNOSIS — B351 Tinea unguium: Secondary | ICD-10-CM | POA: Diagnosis not present

## 2016-01-06 DIAGNOSIS — D696 Thrombocytopenia, unspecified: Secondary | ICD-10-CM | POA: Diagnosis not present

## 2016-01-06 DIAGNOSIS — J40 Bronchitis, not specified as acute or chronic: Secondary | ICD-10-CM | POA: Diagnosis not present

## 2016-01-07 DIAGNOSIS — H26491 Other secondary cataract, right eye: Secondary | ICD-10-CM | POA: Diagnosis not present

## 2016-01-07 DIAGNOSIS — Z961 Presence of intraocular lens: Secondary | ICD-10-CM | POA: Diagnosis not present

## 2016-01-07 DIAGNOSIS — H18413 Arcus senilis, bilateral: Secondary | ICD-10-CM | POA: Diagnosis not present

## 2016-01-07 DIAGNOSIS — H26493 Other secondary cataract, bilateral: Secondary | ICD-10-CM | POA: Diagnosis not present

## 2016-01-07 DIAGNOSIS — H02839 Dermatochalasis of unspecified eye, unspecified eyelid: Secondary | ICD-10-CM | POA: Diagnosis not present

## 2016-01-15 DIAGNOSIS — D72819 Decreased white blood cell count, unspecified: Secondary | ICD-10-CM | POA: Diagnosis not present

## 2016-01-15 DIAGNOSIS — D696 Thrombocytopenia, unspecified: Secondary | ICD-10-CM | POA: Diagnosis not present

## 2016-01-15 DIAGNOSIS — D6949 Other primary thrombocytopenia: Secondary | ICD-10-CM | POA: Diagnosis not present

## 2016-01-23 DIAGNOSIS — D6949 Other primary thrombocytopenia: Secondary | ICD-10-CM | POA: Diagnosis not present

## 2016-01-23 DIAGNOSIS — D72819 Decreased white blood cell count, unspecified: Secondary | ICD-10-CM | POA: Diagnosis not present

## 2016-01-23 DIAGNOSIS — R140 Abdominal distension (gaseous): Secondary | ICD-10-CM | POA: Diagnosis not present

## 2016-01-29 DIAGNOSIS — R935 Abnormal findings on diagnostic imaging of other abdominal regions, including retroperitoneum: Secondary | ICD-10-CM | POA: Diagnosis not present

## 2016-01-29 DIAGNOSIS — D696 Thrombocytopenia, unspecified: Secondary | ICD-10-CM | POA: Diagnosis not present

## 2016-02-03 DIAGNOSIS — N2889 Other specified disorders of kidney and ureter: Secondary | ICD-10-CM | POA: Diagnosis not present

## 2016-02-03 DIAGNOSIS — R140 Abdominal distension (gaseous): Secondary | ICD-10-CM | POA: Diagnosis not present

## 2016-02-03 DIAGNOSIS — N289 Disorder of kidney and ureter, unspecified: Secondary | ICD-10-CM | POA: Diagnosis not present

## 2016-02-05 DIAGNOSIS — N2889 Other specified disorders of kidney and ureter: Secondary | ICD-10-CM | POA: Diagnosis not present

## 2016-02-05 DIAGNOSIS — L03011 Cellulitis of right finger: Secondary | ICD-10-CM | POA: Diagnosis not present

## 2016-02-05 DIAGNOSIS — D696 Thrombocytopenia, unspecified: Secondary | ICD-10-CM | POA: Diagnosis not present

## 2016-02-05 DIAGNOSIS — B351 Tinea unguium: Secondary | ICD-10-CM | POA: Diagnosis not present

## 2016-02-05 DIAGNOSIS — Z23 Encounter for immunization: Secondary | ICD-10-CM | POA: Diagnosis not present

## 2016-02-18 DIAGNOSIS — L609 Nail disorder, unspecified: Secondary | ICD-10-CM | POA: Diagnosis not present

## 2016-02-18 DIAGNOSIS — B351 Tinea unguium: Secondary | ICD-10-CM | POA: Diagnosis not present

## 2016-03-02 DIAGNOSIS — M150 Primary generalized (osteo)arthritis: Secondary | ICD-10-CM | POA: Diagnosis not present

## 2016-03-02 DIAGNOSIS — D696 Thrombocytopenia, unspecified: Secondary | ICD-10-CM | POA: Diagnosis not present

## 2016-03-02 DIAGNOSIS — N400 Enlarged prostate without lower urinary tract symptoms: Secondary | ICD-10-CM | POA: Diagnosis not present

## 2016-03-02 DIAGNOSIS — E782 Mixed hyperlipidemia: Secondary | ICD-10-CM | POA: Diagnosis not present

## 2016-03-02 DIAGNOSIS — D126 Benign neoplasm of colon, unspecified: Secondary | ICD-10-CM | POA: Diagnosis not present

## 2016-03-02 DIAGNOSIS — Z0000 Encounter for general adult medical examination without abnormal findings: Secondary | ICD-10-CM | POA: Diagnosis not present

## 2016-03-02 DIAGNOSIS — R7301 Impaired fasting glucose: Secondary | ICD-10-CM | POA: Diagnosis not present

## 2016-03-02 DIAGNOSIS — B351 Tinea unguium: Secondary | ICD-10-CM | POA: Diagnosis not present

## 2016-03-17 DIAGNOSIS — L918 Other hypertrophic disorders of the skin: Secondary | ICD-10-CM | POA: Diagnosis not present

## 2016-03-17 DIAGNOSIS — L570 Actinic keratosis: Secondary | ICD-10-CM | POA: Diagnosis not present

## 2016-03-17 DIAGNOSIS — L853 Xerosis cutis: Secondary | ICD-10-CM | POA: Diagnosis not present

## 2016-03-17 DIAGNOSIS — L821 Other seborrheic keratosis: Secondary | ICD-10-CM | POA: Diagnosis not present

## 2016-05-26 DIAGNOSIS — R972 Elevated prostate specific antigen [PSA]: Secondary | ICD-10-CM | POA: Diagnosis not present

## 2016-05-26 DIAGNOSIS — N401 Enlarged prostate with lower urinary tract symptoms: Secondary | ICD-10-CM | POA: Diagnosis not present

## 2016-05-28 DIAGNOSIS — D696 Thrombocytopenia, unspecified: Secondary | ICD-10-CM | POA: Diagnosis not present

## 2016-05-28 DIAGNOSIS — D72819 Decreased white blood cell count, unspecified: Secondary | ICD-10-CM | POA: Diagnosis not present

## 2016-08-22 DIAGNOSIS — N400 Enlarged prostate without lower urinary tract symptoms: Secondary | ICD-10-CM | POA: Diagnosis not present

## 2016-08-22 DIAGNOSIS — Z430 Encounter for attention to tracheostomy: Secondary | ICD-10-CM | POA: Diagnosis not present

## 2016-08-22 DIAGNOSIS — S069X9A Unspecified intracranial injury with loss of consciousness of unspecified duration, initial encounter: Secondary | ICD-10-CM | POA: Diagnosis not present

## 2016-08-22 DIAGNOSIS — R402222 Coma scale, best verbal response, incomprehensible words, at arrival to emergency department: Secondary | ICD-10-CM | POA: Diagnosis not present

## 2016-08-22 DIAGNOSIS — Y999 Unspecified external cause status: Secondary | ICD-10-CM | POA: Diagnosis not present

## 2016-08-22 DIAGNOSIS — R4020 Unspecified coma: Secondary | ICD-10-CM | POA: Diagnosis not present

## 2016-08-22 DIAGNOSIS — I639 Cerebral infarction, unspecified: Secondary | ICD-10-CM | POA: Diagnosis not present

## 2016-08-22 DIAGNOSIS — Z041 Encounter for examination and observation following transport accident: Secondary | ICD-10-CM | POA: Diagnosis not present

## 2016-08-22 DIAGNOSIS — G253 Myoclonus: Secondary | ICD-10-CM | POA: Diagnosis not present

## 2016-08-22 DIAGNOSIS — R404 Transient alteration of awareness: Secondary | ICD-10-CM | POA: Diagnosis not present

## 2016-08-22 DIAGNOSIS — R6889 Other general symptoms and signs: Secondary | ICD-10-CM | POA: Diagnosis not present

## 2016-08-22 DIAGNOSIS — E871 Hypo-osmolality and hyponatremia: Secondary | ICD-10-CM | POA: Diagnosis not present

## 2016-08-22 DIAGNOSIS — E1122 Type 2 diabetes mellitus with diabetic chronic kidney disease: Secondary | ICD-10-CM | POA: Diagnosis not present

## 2016-08-22 DIAGNOSIS — E861 Hypovolemia: Secondary | ICD-10-CM | POA: Diagnosis not present

## 2016-08-22 DIAGNOSIS — D72819 Decreased white blood cell count, unspecified: Secondary | ICD-10-CM | POA: Diagnosis not present

## 2016-08-22 DIAGNOSIS — R918 Other nonspecific abnormal finding of lung field: Secondary | ICD-10-CM | POA: Diagnosis not present

## 2016-08-22 DIAGNOSIS — Y92410 Unspecified street and highway as the place of occurrence of the external cause: Secondary | ICD-10-CM | POA: Diagnosis not present

## 2016-08-22 DIAGNOSIS — R1310 Dysphagia, unspecified: Secondary | ICD-10-CM | POA: Diagnosis not present

## 2016-08-22 DIAGNOSIS — D696 Thrombocytopenia, unspecified: Secondary | ICD-10-CM | POA: Diagnosis not present

## 2016-08-22 DIAGNOSIS — Z87891 Personal history of nicotine dependence: Secondary | ICD-10-CM | POA: Diagnosis not present

## 2016-08-22 DIAGNOSIS — R402352 Coma scale, best motor response, localizes pain, at arrival to emergency department: Secondary | ICD-10-CM | POA: Diagnosis not present

## 2016-08-22 DIAGNOSIS — S0990XA Unspecified injury of head, initial encounter: Secondary | ICD-10-CM | POA: Diagnosis not present

## 2016-08-22 DIAGNOSIS — E669 Obesity, unspecified: Secondary | ICD-10-CM | POA: Diagnosis not present

## 2016-08-22 DIAGNOSIS — Z6832 Body mass index (BMI) 32.0-32.9, adult: Secondary | ICD-10-CM | POA: Diagnosis not present

## 2016-08-22 DIAGNOSIS — Z7982 Long term (current) use of aspirin: Secondary | ICD-10-CM | POA: Diagnosis not present

## 2016-08-22 DIAGNOSIS — G9349 Other encephalopathy: Secondary | ICD-10-CM | POA: Diagnosis not present

## 2016-08-22 DIAGNOSIS — R402112 Coma scale, eyes open, never, at arrival to emergency department: Secondary | ICD-10-CM | POA: Diagnosis not present

## 2016-08-22 DIAGNOSIS — R55 Syncope and collapse: Secondary | ICD-10-CM | POA: Diagnosis not present

## 2016-08-22 DIAGNOSIS — I69391 Dysphagia following cerebral infarction: Secondary | ICD-10-CM | POA: Diagnosis not present

## 2016-08-22 DIAGNOSIS — D61818 Other pancytopenia: Secondary | ICD-10-CM | POA: Diagnosis not present

## 2016-08-22 DIAGNOSIS — E872 Acidosis: Secondary | ICD-10-CM | POA: Diagnosis not present

## 2016-08-22 DIAGNOSIS — E785 Hyperlipidemia, unspecified: Secondary | ICD-10-CM | POA: Diagnosis not present

## 2016-08-22 DIAGNOSIS — M898X8 Other specified disorders of bone, other site: Secondary | ICD-10-CM | POA: Diagnosis not present

## 2016-08-22 DIAGNOSIS — J9811 Atelectasis: Secondary | ICD-10-CM | POA: Diagnosis not present

## 2016-08-22 DIAGNOSIS — Z781 Physical restraint status: Secondary | ICD-10-CM | POA: Diagnosis not present

## 2016-08-22 DIAGNOSIS — N182 Chronic kidney disease, stage 2 (mild): Secondary | ICD-10-CM | POA: Diagnosis not present

## 2016-08-23 DIAGNOSIS — R55 Syncope and collapse: Secondary | ICD-10-CM | POA: Diagnosis not present

## 2016-08-23 DIAGNOSIS — R1310 Dysphagia, unspecified: Secondary | ICD-10-CM | POA: Diagnosis not present

## 2016-08-23 DIAGNOSIS — I638 Other cerebral infarction: Secondary | ICD-10-CM | POA: Diagnosis not present

## 2016-08-23 DIAGNOSIS — I69391 Dysphagia following cerebral infarction: Secondary | ICD-10-CM | POA: Diagnosis not present

## 2016-08-23 DIAGNOSIS — Z9911 Dependence on respirator [ventilator] status: Secondary | ICD-10-CM | POA: Diagnosis not present

## 2016-08-23 DIAGNOSIS — Z781 Physical restraint status: Secondary | ICD-10-CM | POA: Diagnosis not present

## 2016-08-23 DIAGNOSIS — Y999 Unspecified external cause status: Secondary | ICD-10-CM | POA: Diagnosis not present

## 2016-08-23 DIAGNOSIS — D696 Thrombocytopenia, unspecified: Secondary | ICD-10-CM | POA: Diagnosis not present

## 2016-08-23 DIAGNOSIS — D72819 Decreased white blood cell count, unspecified: Secondary | ICD-10-CM | POA: Diagnosis not present

## 2016-08-23 DIAGNOSIS — R4182 Altered mental status, unspecified: Secondary | ICD-10-CM | POA: Diagnosis not present

## 2016-08-23 DIAGNOSIS — H532 Diplopia: Secondary | ICD-10-CM | POA: Diagnosis not present

## 2016-08-23 DIAGNOSIS — J9600 Acute respiratory failure, unspecified whether with hypoxia or hypercapnia: Secondary | ICD-10-CM | POA: Diagnosis not present

## 2016-08-23 DIAGNOSIS — Z430 Encounter for attention to tracheostomy: Secondary | ICD-10-CM | POA: Diagnosis not present

## 2016-08-23 DIAGNOSIS — D61818 Other pancytopenia: Secondary | ICD-10-CM | POA: Diagnosis not present

## 2016-08-23 DIAGNOSIS — M898X8 Other specified disorders of bone, other site: Secondary | ICD-10-CM | POA: Diagnosis not present

## 2016-08-23 DIAGNOSIS — G9340 Encephalopathy, unspecified: Secondary | ICD-10-CM | POA: Diagnosis not present

## 2016-08-23 DIAGNOSIS — E871 Hypo-osmolality and hyponatremia: Secondary | ICD-10-CM | POA: Diagnosis not present

## 2016-08-23 DIAGNOSIS — R4020 Unspecified coma: Secondary | ICD-10-CM | POA: Diagnosis not present

## 2016-08-23 DIAGNOSIS — Z6832 Body mass index (BMI) 32.0-32.9, adult: Secondary | ICD-10-CM | POA: Diagnosis not present

## 2016-08-23 DIAGNOSIS — E1122 Type 2 diabetes mellitus with diabetic chronic kidney disease: Secondary | ICD-10-CM | POA: Diagnosis not present

## 2016-08-23 DIAGNOSIS — R402222 Coma scale, best verbal response, incomprehensible words, at arrival to emergency department: Secondary | ICD-10-CM | POA: Diagnosis not present

## 2016-08-23 DIAGNOSIS — R918 Other nonspecific abnormal finding of lung field: Secondary | ICD-10-CM | POA: Diagnosis not present

## 2016-08-23 DIAGNOSIS — R9431 Abnormal electrocardiogram [ECG] [EKG]: Secondary | ICD-10-CM | POA: Diagnosis not present

## 2016-08-23 DIAGNOSIS — I639 Cerebral infarction, unspecified: Secondary | ICD-10-CM | POA: Diagnosis not present

## 2016-08-23 DIAGNOSIS — Z87891 Personal history of nicotine dependence: Secondary | ICD-10-CM | POA: Diagnosis not present

## 2016-08-23 DIAGNOSIS — Z041 Encounter for examination and observation following transport accident: Secondary | ICD-10-CM | POA: Diagnosis not present

## 2016-08-23 DIAGNOSIS — R402441 Other coma, without documented Glasgow coma scale score, or with partial score reported, in the field [EMT or ambulance]: Secondary | ICD-10-CM | POA: Diagnosis not present

## 2016-08-23 DIAGNOSIS — R402352 Coma scale, best motor response, localizes pain, at arrival to emergency department: Secondary | ICD-10-CM | POA: Diagnosis not present

## 2016-08-23 DIAGNOSIS — E785 Hyperlipidemia, unspecified: Secondary | ICD-10-CM | POA: Diagnosis not present

## 2016-08-23 DIAGNOSIS — R9401 Abnormal electroencephalogram [EEG]: Secondary | ICD-10-CM | POA: Diagnosis not present

## 2016-08-23 DIAGNOSIS — G9349 Other encephalopathy: Secondary | ICD-10-CM | POA: Diagnosis not present

## 2016-08-23 DIAGNOSIS — E8809 Other disorders of plasma-protein metabolism, not elsewhere classified: Secondary | ICD-10-CM | POA: Diagnosis not present

## 2016-08-23 DIAGNOSIS — E861 Hypovolemia: Secondary | ICD-10-CM | POA: Diagnosis not present

## 2016-08-23 DIAGNOSIS — R531 Weakness: Secondary | ICD-10-CM | POA: Diagnosis not present

## 2016-08-23 DIAGNOSIS — R402112 Coma scale, eyes open, never, at arrival to emergency department: Secondary | ICD-10-CM | POA: Diagnosis not present

## 2016-08-23 DIAGNOSIS — R0989 Other specified symptoms and signs involving the circulatory and respiratory systems: Secondary | ICD-10-CM | POA: Diagnosis not present

## 2016-08-23 DIAGNOSIS — Z431 Encounter for attention to gastrostomy: Secondary | ICD-10-CM | POA: Diagnosis not present

## 2016-08-23 DIAGNOSIS — J9811 Atelectasis: Secondary | ICD-10-CM | POA: Diagnosis not present

## 2016-08-23 DIAGNOSIS — E872 Acidosis: Secondary | ICD-10-CM | POA: Diagnosis not present

## 2016-08-23 DIAGNOSIS — G253 Myoclonus: Secondary | ICD-10-CM | POA: Diagnosis not present

## 2016-08-23 DIAGNOSIS — N400 Enlarged prostate without lower urinary tract symptoms: Secondary | ICD-10-CM | POA: Diagnosis not present

## 2016-08-23 DIAGNOSIS — Y92410 Unspecified street and highway as the place of occurrence of the external cause: Secondary | ICD-10-CM | POA: Diagnosis not present

## 2016-08-23 DIAGNOSIS — Z7982 Long term (current) use of aspirin: Secondary | ICD-10-CM | POA: Diagnosis not present

## 2016-08-23 DIAGNOSIS — E669 Obesity, unspecified: Secondary | ICD-10-CM | POA: Diagnosis not present

## 2016-08-23 DIAGNOSIS — N182 Chronic kidney disease, stage 2 (mild): Secondary | ICD-10-CM | POA: Diagnosis not present

## 2016-08-24 DIAGNOSIS — Z431 Encounter for attention to gastrostomy: Secondary | ICD-10-CM | POA: Diagnosis not present

## 2016-08-24 DIAGNOSIS — R531 Weakness: Secondary | ICD-10-CM | POA: Diagnosis not present

## 2016-08-24 DIAGNOSIS — J9601 Acute respiratory failure with hypoxia: Secondary | ICD-10-CM | POA: Diagnosis not present

## 2016-08-24 DIAGNOSIS — D696 Thrombocytopenia, unspecified: Secondary | ICD-10-CM | POA: Diagnosis not present

## 2016-08-24 DIAGNOSIS — F1320 Sedative, hypnotic or anxiolytic dependence, uncomplicated: Secondary | ICD-10-CM | POA: Diagnosis not present

## 2016-08-24 DIAGNOSIS — E8809 Other disorders of plasma-protein metabolism, not elsewhere classified: Secondary | ICD-10-CM | POA: Diagnosis not present

## 2016-08-24 DIAGNOSIS — Z7982 Long term (current) use of aspirin: Secondary | ICD-10-CM | POA: Diagnosis not present

## 2016-08-24 DIAGNOSIS — Z9911 Dependence on respirator [ventilator] status: Secondary | ICD-10-CM | POA: Diagnosis not present

## 2016-08-24 DIAGNOSIS — Q211 Atrial septal defect: Secondary | ICD-10-CM | POA: Diagnosis not present

## 2016-08-24 DIAGNOSIS — Z6830 Body mass index (BMI) 30.0-30.9, adult: Secondary | ICD-10-CM | POA: Diagnosis not present

## 2016-08-24 DIAGNOSIS — R55 Syncope and collapse: Secondary | ICD-10-CM | POA: Diagnosis not present

## 2016-08-24 DIAGNOSIS — I10 Essential (primary) hypertension: Secondary | ICD-10-CM | POA: Diagnosis not present

## 2016-08-24 DIAGNOSIS — N189 Chronic kidney disease, unspecified: Secondary | ICD-10-CM | POA: Diagnosis not present

## 2016-08-24 DIAGNOSIS — J9811 Atelectasis: Secondary | ICD-10-CM | POA: Diagnosis not present

## 2016-08-24 DIAGNOSIS — I638 Other cerebral infarction: Secondary | ICD-10-CM | POA: Diagnosis not present

## 2016-08-24 DIAGNOSIS — I639 Cerebral infarction, unspecified: Secondary | ICD-10-CM | POA: Diagnosis not present

## 2016-08-24 DIAGNOSIS — R4020 Unspecified coma: Secondary | ICD-10-CM | POA: Diagnosis not present

## 2016-08-24 DIAGNOSIS — G9340 Encephalopathy, unspecified: Secondary | ICD-10-CM | POA: Diagnosis not present

## 2016-08-24 DIAGNOSIS — E119 Type 2 diabetes mellitus without complications: Secondary | ICD-10-CM | POA: Diagnosis not present

## 2016-08-24 DIAGNOSIS — E669 Obesity, unspecified: Secondary | ICD-10-CM | POA: Diagnosis not present

## 2016-08-24 DIAGNOSIS — H538 Other visual disturbances: Secondary | ICD-10-CM | POA: Diagnosis not present

## 2016-08-24 DIAGNOSIS — N400 Enlarged prostate without lower urinary tract symptoms: Secondary | ICD-10-CM | POA: Diagnosis not present

## 2016-08-24 DIAGNOSIS — E785 Hyperlipidemia, unspecified: Secondary | ICD-10-CM | POA: Diagnosis not present

## 2016-08-24 DIAGNOSIS — H532 Diplopia: Secondary | ICD-10-CM | POA: Diagnosis not present

## 2016-08-25 DIAGNOSIS — I638 Other cerebral infarction: Secondary | ICD-10-CM | POA: Diagnosis not present

## 2016-08-25 DIAGNOSIS — N400 Enlarged prostate without lower urinary tract symptoms: Secondary | ICD-10-CM | POA: Diagnosis not present

## 2016-08-25 DIAGNOSIS — I639 Cerebral infarction, unspecified: Secondary | ICD-10-CM | POA: Diagnosis not present

## 2016-08-25 DIAGNOSIS — D649 Anemia, unspecified: Secondary | ICD-10-CM | POA: Diagnosis not present

## 2016-08-25 DIAGNOSIS — Z7982 Long term (current) use of aspirin: Secondary | ICD-10-CM | POA: Diagnosis not present

## 2016-08-25 DIAGNOSIS — R918 Other nonspecific abnormal finding of lung field: Secondary | ICD-10-CM | POA: Diagnosis not present

## 2016-08-25 DIAGNOSIS — R410 Disorientation, unspecified: Secondary | ICD-10-CM | POA: Diagnosis not present

## 2016-08-25 DIAGNOSIS — Z431 Encounter for attention to gastrostomy: Secondary | ICD-10-CM | POA: Diagnosis not present

## 2016-08-25 DIAGNOSIS — F1320 Sedative, hypnotic or anxiolytic dependence, uncomplicated: Secondary | ICD-10-CM | POA: Diagnosis not present

## 2016-08-25 DIAGNOSIS — R1310 Dysphagia, unspecified: Secondary | ICD-10-CM | POA: Diagnosis not present

## 2016-08-25 DIAGNOSIS — G9340 Encephalopathy, unspecified: Secondary | ICD-10-CM | POA: Diagnosis not present

## 2016-08-25 DIAGNOSIS — J9811 Atelectasis: Secondary | ICD-10-CM | POA: Diagnosis not present

## 2016-08-25 DIAGNOSIS — D696 Thrombocytopenia, unspecified: Secondary | ICD-10-CM | POA: Diagnosis not present

## 2016-08-25 DIAGNOSIS — D61818 Other pancytopenia: Secondary | ICD-10-CM | POA: Diagnosis not present

## 2016-08-25 DIAGNOSIS — E785 Hyperlipidemia, unspecified: Secondary | ICD-10-CM | POA: Diagnosis not present

## 2016-08-26 DIAGNOSIS — F1320 Sedative, hypnotic or anxiolytic dependence, uncomplicated: Secondary | ICD-10-CM | POA: Diagnosis not present

## 2016-08-26 DIAGNOSIS — J9811 Atelectasis: Secondary | ICD-10-CM | POA: Diagnosis not present

## 2016-08-26 DIAGNOSIS — I638 Other cerebral infarction: Secondary | ICD-10-CM | POA: Diagnosis not present

## 2016-08-26 DIAGNOSIS — N400 Enlarged prostate without lower urinary tract symptoms: Secondary | ICD-10-CM | POA: Diagnosis not present

## 2016-08-26 DIAGNOSIS — Z7982 Long term (current) use of aspirin: Secondary | ICD-10-CM | POA: Diagnosis not present

## 2016-08-26 DIAGNOSIS — E785 Hyperlipidemia, unspecified: Secondary | ICD-10-CM | POA: Diagnosis not present

## 2016-08-26 DIAGNOSIS — D61818 Other pancytopenia: Secondary | ICD-10-CM | POA: Diagnosis not present

## 2016-08-26 DIAGNOSIS — I639 Cerebral infarction, unspecified: Secondary | ICD-10-CM | POA: Diagnosis not present

## 2016-08-26 DIAGNOSIS — G9340 Encephalopathy, unspecified: Secondary | ICD-10-CM | POA: Diagnosis not present

## 2016-08-26 DIAGNOSIS — D696 Thrombocytopenia, unspecified: Secondary | ICD-10-CM | POA: Diagnosis not present

## 2016-08-26 DIAGNOSIS — R410 Disorientation, unspecified: Secondary | ICD-10-CM | POA: Diagnosis not present

## 2016-08-26 DIAGNOSIS — E871 Hypo-osmolality and hyponatremia: Secondary | ICD-10-CM | POA: Diagnosis not present

## 2016-08-26 DIAGNOSIS — R1310 Dysphagia, unspecified: Secondary | ICD-10-CM | POA: Diagnosis not present

## 2016-08-27 DIAGNOSIS — Z7982 Long term (current) use of aspirin: Secondary | ICD-10-CM | POA: Diagnosis not present

## 2016-08-27 DIAGNOSIS — R9431 Abnormal electrocardiogram [ECG] [EKG]: Secondary | ICD-10-CM | POA: Diagnosis not present

## 2016-08-27 DIAGNOSIS — I454 Nonspecific intraventricular block: Secondary | ICD-10-CM | POA: Diagnosis not present

## 2016-08-27 DIAGNOSIS — F1320 Sedative, hypnotic or anxiolytic dependence, uncomplicated: Secondary | ICD-10-CM | POA: Diagnosis not present

## 2016-08-27 DIAGNOSIS — I638 Other cerebral infarction: Secondary | ICD-10-CM | POA: Diagnosis not present

## 2016-08-28 DIAGNOSIS — I638 Other cerebral infarction: Secondary | ICD-10-CM | POA: Diagnosis not present

## 2016-08-28 DIAGNOSIS — F1320 Sedative, hypnotic or anxiolytic dependence, uncomplicated: Secondary | ICD-10-CM | POA: Diagnosis not present

## 2016-08-28 DIAGNOSIS — Z7982 Long term (current) use of aspirin: Secondary | ICD-10-CM | POA: Diagnosis not present

## 2016-08-29 DIAGNOSIS — Z7982 Long term (current) use of aspirin: Secondary | ICD-10-CM | POA: Diagnosis not present

## 2016-08-29 DIAGNOSIS — I638 Other cerebral infarction: Secondary | ICD-10-CM | POA: Diagnosis not present

## 2016-08-29 DIAGNOSIS — F1320 Sedative, hypnotic or anxiolytic dependence, uncomplicated: Secondary | ICD-10-CM | POA: Diagnosis not present

## 2016-08-29 DIAGNOSIS — Z8673 Personal history of transient ischemic attack (TIA), and cerebral infarction without residual deficits: Secondary | ICD-10-CM

## 2016-08-29 DIAGNOSIS — I639 Cerebral infarction, unspecified: Secondary | ICD-10-CM

## 2016-08-29 HISTORY — DX: Personal history of transient ischemic attack (TIA), and cerebral infarction without residual deficits: Z86.73

## 2016-08-29 HISTORY — DX: Cerebral infarction, unspecified: I63.9

## 2016-08-31 ENCOUNTER — Other Ambulatory Visit: Payer: Self-pay

## 2016-08-31 NOTE — Patient Outreach (Addendum)
[facility]) Care Management  08/31/2016  Troy Bentley [birth year] [id number]   TELEPHONE SCREENING FOR NURSE CALL LINE REFERAL Referral date: 08/28/16 Referral source: Nurse call line  Referral reason: Symptomatic, Request for health information Insurance: Health team advantage  Nurse call line referral received. Request contact to phone number listed on referral. Unable to reach or HIPAA compliant message for patient at [phone] due to phone only ringing.    PLAN: RNCM will attempt 2nd telephone call to patient within 3 business days.   Quinn Plowman RN,BSN,CCM [facility] Telephonic  [phone]

## 2016-09-01 ENCOUNTER — Other Ambulatory Visit: Payer: Self-pay

## 2016-09-01 NOTE — Patient Outreach (Signed)
[facility]) Care Management  09/01/2016  Troy Bentley 17-Jan-1946 [id number]   TELEPHONE SCREENING FOR NURSE CALL LINE REFERAL Referral date: 08/28/16 Referral source: Nurse call line  Referral reason: Symptomatic, Request for health information Insurance: Health team advantage Attempt #2  Nurse call line referral received. Request contact to phone number listed on referral. Contact answering phone identified herself as patients wife. RNCM explained to wife that verbal authorization would be needed from patient to speak with her if he is able to.  Wife states she is not at home at this time.States patient is able to give verbal authorization. Wife request call back in the am to speak with patient.    PLAN: RNCM will attempt 3rd telephone outreach to patient within 3 business days.   Quinn Plowman RN,BSN,CCM [facility] Telephonic  [phone]

## 2016-09-02 DIAGNOSIS — M6281 Muscle weakness (generalized): Secondary | ICD-10-CM | POA: Diagnosis not present

## 2016-09-02 DIAGNOSIS — D696 Thrombocytopenia, unspecified: Secondary | ICD-10-CM | POA: Diagnosis not present

## 2016-09-02 DIAGNOSIS — I639 Cerebral infarction, unspecified: Secondary | ICD-10-CM | POA: Diagnosis not present

## 2016-09-02 DIAGNOSIS — R488 Other symbolic dysfunctions: Secondary | ICD-10-CM | POA: Diagnosis not present

## 2016-09-02 DIAGNOSIS — R279 Unspecified lack of coordination: Secondary | ICD-10-CM | POA: Diagnosis not present

## 2016-09-03 ENCOUNTER — Other Ambulatory Visit: Payer: Self-pay

## 2016-09-03 DIAGNOSIS — E782 Mixed hyperlipidemia: Secondary | ICD-10-CM | POA: Diagnosis not present

## 2016-09-03 DIAGNOSIS — R7301 Impaired fasting glucose: Secondary | ICD-10-CM | POA: Diagnosis not present

## 2016-09-03 DIAGNOSIS — N400 Enlarged prostate without lower urinary tract symptoms: Secondary | ICD-10-CM | POA: Diagnosis not present

## 2016-09-03 DIAGNOSIS — R509 Fever, unspecified: Secondary | ICD-10-CM | POA: Diagnosis not present

## 2016-09-03 DIAGNOSIS — I639 Cerebral infarction, unspecified: Secondary | ICD-10-CM | POA: Diagnosis not present

## 2016-09-03 DIAGNOSIS — D696 Thrombocytopenia, unspecified: Secondary | ICD-10-CM | POA: Diagnosis not present

## 2016-09-03 NOTE — Telephone Encounter (Signed)
This encounter was created in error - please disregard.

## 2016-09-04 NOTE — Patient Outreach (Signed)
Troy Bentley [facility]) Care Management  09/04/2016  Troy Bentley 1945/04/01 [id number]  Entry for 09/03/16 Transition of care  Week # 1 Referral date: 08/28/16 Referral source: Nurse call center Program: transition of care Insurance: Health team advantage Providers: Senaida Lange - Patram Social support:  Lives with wife   SUBJECTIVE: Telephone call to patient regarding transition of care follow up. HIPAA verified with patient. Patient request his wife be spoke to regarding all of his personal health information. RNCM informed patient/ wife call is for transition of care follow up. Wife agreed to ongoing calls from [facility] for transition of care.  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 Wife states patient did not qualify to go to a skilled nursing facility. Wife states patient is receiving home health services for physical therapy, occupational therapy , speech therapy, and nurse with [facility].  Wife states patient has left sided weakness and his eyes don't close all of the way when he is sleeping.  Wife states she was not please with the discharge process at [place] advised wife to call the patient experience department to speak with someone regarding her concerns.  Wife states patient saw his primary MD today. States patient sees the Designer, jewellery. Wife states she and her husband feel very comfortable with the answers and follow up care he received from the nurse practitioner.  Wife states patients primary did blood work due to patient running a fever and having some chest congestion. States they are awaiting results.  Wife states patient has an appointment with the neurologist in September 2018.   Wife states patient has all of his medications and is taking his medications as prescribed.  Wife in agreement to next follow up call with RNCM.  RNCM advised patient and/or spouse to notify MD of any changes in condition prior to scheduled  appointment. RNCM provided contact name and number: [phone] or main office number [phone] and 24 hour nurse advise line [phone].  RNCM verified patient aware of 911 services for urgent/ emergent needs.  ASSESSMENT: Admission to [facility] from August 23, 2016 to August 28, 2016. Primary provider follow up on 09/03/16: diagnosis per primary provider Diagnoses this Encounter 1. Cerebrovascular accident (CVA), unspecified mechanism ([place])  2. IFG (impaired fasting glucose)  3. Benign prostatic hyperplasia without lower urinary tract symptoms UA, Microscopic If Indicated By Dipstick  UA, Microscopic If Indicated By Dipstick  4. Mixed hyperlipidemia  5. Thrombocytopenia (HCC) CBC and Differential  CBC and Differential  CBC and Differential  6. Febrile illness Comprehensive Metabolic Panel  Urine Culture  Comprehensive Metabolic Panel  Urine Culture     PLAN:  RNCM will contact patient and/or spouse within 1 week for transition of care follow up. RNCM will send patient [facility] care management welcome packet, consent form, and welcome letter.  RNCM will send EMMI education material to patient.  RNCM will send involvement letter to primary provider.   Troy Plowman RN,BSN,CCM [facility] Telephonic  [phone]

## 2016-09-07 DIAGNOSIS — Q211 Atrial septal defect: Secondary | ICD-10-CM | POA: Insufficient documentation

## 2016-09-07 DIAGNOSIS — Q2112 Patent foramen ovale: Secondary | ICD-10-CM

## 2016-09-07 HISTORY — DX: Patent foramen ovale: Q21.12

## 2016-09-08 ENCOUNTER — Encounter: Payer: Self-pay | Admitting: Cardiology

## 2016-09-08 DIAGNOSIS — Q2572 Congenital pulmonary arteriovenous malformation: Secondary | ICD-10-CM

## 2016-09-08 HISTORY — DX: Congenital pulmonary arteriovenous malformation: Q25.72

## 2016-09-10 ENCOUNTER — Ambulatory Visit: Payer: Self-pay

## 2016-09-11 ENCOUNTER — Other Ambulatory Visit: Payer: Self-pay

## 2016-09-11 NOTE — Patient Outreach (Signed)
[facility]) Care Management  [place]  09/11/2016   Troy Bentley 1946/01/09 [id number]  Entry for 09/03/16 Transition of care  Week # 1 Referral date: 08/28/16 Referral source: Nurse call center Program: transition of care Insurance: Health team advantage Providers: Senaida Lange - [place] Social support:  Lives with wife  INITIAL TELEPHONE ASSESSMENT:  Subjective: telephone call to patients wife regarding transition of care follow up. Wife verified HIPAA for patient. Wife states patient continues to progress well with his therapies. Wife states she is encouraging patient to continue with his home health therapies and to increase the intensity of his therapy as he moves forward. Wife states she desires for her husband to return to optimal health. Wife denies patient having any new onset of symptoms. RNCM reviewed signs/ symptoms of stroke with wife. Advised to call 911 for stroke like symptoms.  Wife reports she spoke with Eduard Clos, social worker with [facility] care management regarding obtaining a shower chair for the patient.  Wife states the insurance company will not cover it so she has ordered it online and is waiting for delivery.  Wife verbalized agreement to next telephone outreach with [facility].   Objective: see assessment  Encounter Medications:  Outpatient Encounter Prescriptions as of 09/11/2016  Medication Sig Note  . alfuzosin (UROXATRAL) 10 MG 24 hr tablet Take 10 mg by mouth daily with breakfast.   . Ascorbic Acid (VITAMIN C) 1000 MG tablet Take 1,000 mg by mouth 2 (two) times daily. 09/11/2016: Taking differently. Takes 1 time per day.  . Multiple Vitamin (MULTIVITAMIN WITH MINERALS) TABS Take 1 tablet by mouth daily.   . polyethylene glycol (MIRALAX / GLYCOLAX) packet Take 17 g by mouth daily. 1 tablespoon per day   . pravastatin (PRAVACHOL) 80 MG tablet Take 80 mg by mouth daily.   [name] aspirin 81 MG tablet Take 81 mg by mouth daily.   .  methocarbamol (ROBAXIN) 500 MG tablet Take 1 tablet (500 mg total) by mouth 3 (three) times daily as needed. (Patient not taking: Reported on 09/11/2016) 09/11/2016: Doctor discontinued.   . Misc Natural Products (URINOZINC PO) Take by mouth. 1 time per day   . oxyCODONE-acetaminophen (ROXICET) 5-325 MG per tablet Take 1-2 tablets by mouth every 4 (four) hours as needed for pain. (Patient not taking: Reported on 09/11/2016)   . RESVERATROL PO Take 1 tablet by mouth daily. 09/11/2016: Doctor discontinue  . vitamin E 400 UNIT capsule Take 400 Units by mouth daily. 09/11/2016: Doctor discontinue   No facility-administered encounter medications on file as of 09/11/2016.     Functional Status:  In your present state of health, do you have any difficulty performing the following activities: 09/11/2016  Hearing? N  Vision? N  Difficulty concentrating or making decisions? Y  Walking or climbing stairs? N  Dressing or bathing? Y  Doing errands, shopping? Y  Preparing Food and eating ? Y  Using the Toilet? N  In the past six months, have you accidently leaked urine? N  Do you have problems with loss of bowel control? N  Managing your Medications? Y  Managing your Finances? Y  Housekeeping or managing your Housekeeping? Y  Some recent data might be hidden    Fall/Depression Screening: Fall Risk  09/11/2016  Falls in the past year? No   THN CM Care Plan Problem One     Most Recent Value  Care Plan Problem One  Potential for readmission due to recent hospitalization  Role  Documenting the Problem One  Care Management Telephonic [place] for Problem One  Active  THN Long Term Goal   Patients wife will verbalize no readmission for patient within 28 days  THN Long Term Goal Start Date  09/11/16  Interventions for Problem One Long Term Goal  RNCM advised wife that patient should keep follow up appointments with his doctors, take his medications as prescribed and report symptoms to his  doctor as soon as possible.   THN CM Short Term Goal #1   Patient and/ or wife will verbalize symptoms of stroke within 2 weeks.   THN CM Short Term Goal #1 Start Date  09/11/16  Interventions for Short Term Goal #1  EMMI education material regarding stroke sent to patient by [facility]. RNCM reivewed signs/ symptoms of stroke with patients wife.     THN CM Care Plan Problem Two     Most Recent Value  Care Plan Problem Two  Potential risk for falls  Role Documenting the Problem Two  Care Management Telephonic Coordinator  Care Plan for Problem Two  Active  THN CM Short Term Goal #1   Patient and / or wife will report patient has had no falls within 30 days.   THN CM Short Term Goal #1 Start Date  09/11/16  Interventions for Short Term Goal #2   RNCM discussed fall prevention with wife. RNCM sent EMMI education material on fall prevention.     No flowsheet data found.  Assessment: ongoing transition of care follow up.  RNCM will continue to assist with care coordination as needed.   Plan: RNCM will follow up with patient and/ or wife within 1 week.  RNCM will send copy of today's [facility] care management assessment to patients primary MD.  Quinn Plowman RN,BSN,CCM [facility] Telephonic  [phone]

## 2016-09-14 ENCOUNTER — Ambulatory Visit (INDEPENDENT_AMBULATORY_CARE_PROVIDER_SITE_OTHER): Payer: PPO | Admitting: Cardiology

## 2016-09-14 ENCOUNTER — Encounter: Payer: Self-pay | Admitting: Cardiology

## 2016-09-14 VITALS — BP 114/82 | HR 61 | Ht 71.0 in | Wt 214.4 lb

## 2016-09-14 DIAGNOSIS — E785 Hyperlipidemia, unspecified: Secondary | ICD-10-CM

## 2016-09-14 DIAGNOSIS — I639 Cerebral infarction, unspecified: Secondary | ICD-10-CM

## 2016-09-14 DIAGNOSIS — Q2572 Congenital pulmonary arteriovenous malformation: Secondary | ICD-10-CM

## 2016-09-14 NOTE — Patient Instructions (Addendum)
Medication Instructions:  Your physician recommends that you continue on your current medications as directed. Please refer to the Current Medication list given to you today.   Labwork: None  Testing/Procedures: Your physician has requested that you have a TEE. During a TEE, sound waves are used to create images of your heart. It provides your doctor with information about the size and shape of your heart and how well your heart's chambers and valves are working. In this test, a transducer is attached to the end of a flexible tube that's guided down your throat and into your esophagus (the tube leading from you mouth to your stomach) to get a more detailed image of your heart. You are not awake for the procedure. Please see the instruction sheet given to you today. For further information please visit [email or web address].  Your TEE is scheduled for 09/17/16 at 10 am. You need to arrive to [facility] at 8:30 am on 09/17/16.     Follow-Up: Your physician recommends that you schedule a follow-up appointment in: 1 month   Any Other Special Instructions Will Be Listed Below (If Applicable).     If you need a refill on your cardiac medications before your next appointment, please call your pharmacy.     Stroke Prevention Some health problems and behaviors may make it more likely for you to have a stroke. Below are ways to lessen your risk of having a stroke.  Be active for at least 30 minutes on most or all days.  Do not smoke. Try not to be around others who smoke.  Do not drink too much alcohol. ? Do not have more than 2 drinks a day if you are a man. ? Do not have more than 1 drink a day if you are a woman and are not pregnant.  Eat healthy foods, such as fruits and vegetables. If you were put on a specific diet, follow the diet as told.  Keep your cholesterol levels under control through diet and medicines. Look for foods that are low in saturated fat, trans fat,  cholesterol, and are high in fiber.  If you have diabetes, follow all diet plans and take your medicine as told.  Ask your doctor if you need treatment to lower your blood pressure. If you have high blood pressure (hypertension), follow all diet plans and take your medicine as told by your doctor.  If you are 45-41 years old, have your blood pressure checked every 3-5 years. If you are age 34 or older, have your blood pressure checked every year.  Keep a healthy weight. Eat foods that are low in calories, salt, saturated fat, trans fat, and cholesterol.  Do not take drugs.  Avoid birth control pills, if this applies. Talk to your doctor about the risks of taking birth control pills.  Talk to your doctor if you have sleep problems (sleep apnea).  Take all medicine as told by your doctor. ? You may be told to take aspirin or blood thinner medicine. Take this medicine as told by your doctor. ? Understand your medicine instructions.  Make sure any other conditions you have are being taken care of.  Get help right away if:  You suddenly lose feeling (you feel numb) or have weakness in your face, arm, or leg.  Your face or eyelid hangs down to one side.  You suddenly feel confused.  You have trouble talking (aphasia) or understanding what people are saying.  You suddenly have trouble  seeing in one or both eyes.  You suddenly have trouble walking.  You are dizzy.  You lose your balance or your movements are clumsy (uncoordinated).  You suddenly have a very bad headache and you do not know the cause.  You have new chest pain.  Your heart feels like it is fluttering or skipping a beat (irregular heartbeat). Do not wait to see if the symptoms above go away. Get help right away. Call your local emergency services (911 in [place] Do not drive yourself to the hospital. This information is not intended to replace advice given to you by your health care provider. Make sure you discuss  any questions you have with your health care provider. Document Released: 09/01/2011 Document Revised: 08/08/2015 Document Reviewed: 09/02/2012 Elsevier Interactive Patient Education  [employer].

## 2016-09-14 NOTE — Progress Notes (Signed)
Cardiology Office Note:    Date:  09/14/2016   ID:  JAI STEIL, DOB Jan 03, 1946, MRN [id number]  PCP:  Raina Mina., MD  Cardiologist:  Shirlee More, MD    Referring MD: Raina Mina., MD  ASSESSMENT:    1. Cerebrovascular accident (CVA), unspecified mechanism ([place])   2. Pulmonary arteriovenous malformation   3. Hyperlipidemia, unspecified hyperlipidemia type    PLAN:    In order of problems listed above:  1. Advised him undergo further evaluation looking for evidence of right-to-left shunt starting with a transesophageal echocardiogram. If normal he should undergo further evaluation I favor an implanted loop recorder and evaluation for pulmonary AV malformation. In the interim he'll continue lipid-lowering therapy and aspirin for secondary prophylaxis 2. See above 3. Continue his statin  Next appointment: 1 month   Medication Adjustments/Labs and Tests Ordered: Current medicines are reviewed at length with the patient today.  Concerns regarding medicines are outlined above.  Orders Placed This Encounter  Procedures  . TRANSESOPHAGEAL ECHOCARDIOGRAM W/O CARDIOVERSION   No orders of the defined types were placed in this encounter.    Chief Complaint  Patient presents with  . New Patient (Initial Visit)    recently had a stroke, with PFO  . Cerebrovascular Accident  . 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 male who is being seen today for the evaluation of cardiac source of embolic stroke. He had a recent stroke with AMS and seizure like activity with admission to [facility].He is seen at the request of Raina Mina., MD.While hospitalized he was found to have evidence of right-to-left shunt on a contrast echocardiogram raising concern for either PFO or pulmonary AV malformation. His stroke occurred in the setting of aspirin therapy he has previous stroke by MRI multiple cerebellar and  asked him undergo further evaluation starting with the transesophageal echo. If normal I would favor an implanted loop recorder and then evaluation for pulmonary AV malformation with contrast CTA of the lung. He's had no palpitation chest pain syncope. He has no documented history of atrial fibrillation.   Past Medical History:  Diagnosis Date  . BPH (benign prostatic hyperplasia)   . Compression fracture of lumbar vertebra ([place])   . Hernia, umbilical   . Hyperlipemia   . Kidney stone   . Pneumonia     Past Surgical History:  Procedure Laterality Date  . BACK SURGERY  2005  . EYE SURGERY     bilateral cataract surgery  . ORIF CLAVICULAR FRACTURE  04/15/2012   Procedure: OPEN REDUCTION INTERNAL FIXATION (ORIF) CLAVICULAR FRACTURE;  Surgeon: Augustin Schooling, MD;  Location: [place];  Service: Orthopedics;  Laterality: Left;    Current Medications: Current Meds  Medication Sig  . alfuzosin (UROXATRAL) 10 MG 24 hr tablet Take 10 mg by mouth daily after supper.   . Ascorbic Acid (VITAMIN C) 1000 MG tablet Take 1,000 mg by mouth 2 (two) times daily.  [name] aspirin 81 MG tablet Take 81 mg by mouth QID.   [name] Misc Natural Products (URINOZINC PO) Take by mouth. 1 time per day  . Multiple Vitamin (MULTIVITAMIN WITH MINERALS) TABS Take 1 tablet by mouth daily.  . polyethylene glycol (MIRALAX / GLYCOLAX) packet Take 17 g by mouth daily. 1 tablespoon per day  . pravastatin (PRAVACHOL) 80 MG tablet Take 80 mg by mouth daily.  Allergies:   Fenofibrate   Social History   Social History  . Marital status: Married    Spouse name: N/A  . Number of children: N/A  . Years of education: N/A   Social History Main Topics  . Smoking status: Former Research scientist (life sciences)  . Smokeless tobacco: Never Used     Comment: quit 1991  . Alcohol use No  . Drug use: No  . Sexual activity: Not Asked   Other Topics Concern  . None   Social History Narrative  . None     Family History: The patient's family history  includes Hyperlipidemia in his mother. ROS:   Please see the history of present illness.     All other systems reviewed and are negative.  EKGs/Labs/Other Studies Reviewed:    The following studies were reviewed today: TTE at [facility] 08/24/16:SUMMARY  The left ventricular size is normal.  There is mild concentric left ventricular hypertrophy.  Left ventricular systolic function is normal.  LV ejection fraction = 60-65%.  Left ventricular filling pattern is impaired relaxation.  The right ventricle is mildly dilated with normal systolic function.  The left atrial size is normal.  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  There is aortic valve sclerosis.  The aortic valve opens well.  There is no significant valvular stenosis or regurgitation  IVC size was moderately dilated.  There is no pericardial effusion.  There is no comparison study available.   EKG:  EKG is  ordered today.  The ekg ordered today demonstrates a normal EKG-SRTH.  Recent records in [place] reviewed prior to the visit  Physical Exam:    VS:  BP 114/82   Pulse 61   Ht 5\' 11"  (1.803 m)   Wt 214 lb 6.4 oz (97.3 kg)   SpO2 97%   BMI 29.90 kg/m     Wt Readings from Last 3 Encounters:  09/14/16 214 lb 6.4 oz (97.3 kg)  04/15/12 220 lb (99.8 kg)     GEN:  Well nourished, well developed in no acute distress HEENT: Normal NECK: No JVD; No carotid bruits LYMPHATICS: No lymphadenopathy CARDIAC: RRR, no murmurs, rubs, gallops RESPIRATORY:  Clear to auscultation without rales, wheezing or rhonchi  ABDOMEN: Soft, non-tender, non-distended MUSCULOSKELETAL:  No edema; No deformity  SKIN: Warm and dry NEUROLOGIC:  Alert and oriented x 3 PSYCHIATRIC:  Normal affect     Signed, Shirlee More, MD  09/14/2016 3:32 PM    [facility]

## 2016-09-15 DIAGNOSIS — R279 Unspecified lack of coordination: Secondary | ICD-10-CM | POA: Diagnosis not present

## 2016-09-15 DIAGNOSIS — D696 Thrombocytopenia, unspecified: Secondary | ICD-10-CM | POA: Diagnosis not present

## 2016-09-15 DIAGNOSIS — R488 Other symbolic dysfunctions: Secondary | ICD-10-CM | POA: Diagnosis not present

## 2016-09-15 DIAGNOSIS — M6281 Muscle weakness (generalized): Secondary | ICD-10-CM | POA: Diagnosis not present

## 2016-09-15 DIAGNOSIS — I639 Cerebral infarction, unspecified: Secondary | ICD-10-CM | POA: Diagnosis not present

## 2016-09-15 NOTE — Addendum Note (Signed)
Addended by: Shirlee More on: 09/15/2016 01:04 PM   Modules accepted: Orders, SmartSet

## 2016-09-17 ENCOUNTER — Other Ambulatory Visit: Payer: Self-pay

## 2016-09-17 ENCOUNTER — Ambulatory Visit (HOSPITAL_COMMUNITY)
Admission: RE | Admit: 2016-09-17 | Discharge: 2016-09-17 | Disposition: A | Discharge status: Home / Self Care | Payer: PPO | Source: Ambulatory Visit | Attending: Cardiology | Admitting: Cardiology

## 2016-09-17 ENCOUNTER — Ambulatory Visit (HOSPITAL_BASED_OUTPATIENT_CLINIC_OR_DEPARTMENT_OTHER): Payer: PPO

## 2016-09-17 ENCOUNTER — Encounter (HOSPITAL_COMMUNITY): Payer: Self-pay | Admitting: *Deleted

## 2016-09-17 ENCOUNTER — Encounter (HOSPITAL_COMMUNITY)
Admission: RE | Disposition: A | Discharge status: Home / Self Care | Payer: Self-pay | Source: Ambulatory Visit | Attending: Cardiology

## 2016-09-17 DIAGNOSIS — Z87442 Personal history of urinary calculi: Secondary | ICD-10-CM | POA: Insufficient documentation

## 2016-09-17 DIAGNOSIS — I639 Cerebral infarction, unspecified: Secondary | ICD-10-CM

## 2016-09-17 DIAGNOSIS — Q211 Atrial septal defect: Secondary | ICD-10-CM | POA: Insufficient documentation

## 2016-09-17 DIAGNOSIS — E785 Hyperlipidemia, unspecified: Secondary | ICD-10-CM | POA: Diagnosis not present

## 2016-09-17 DIAGNOSIS — Q2572 Congenital pulmonary arteriovenous malformation: Secondary | ICD-10-CM | POA: Diagnosis not present

## 2016-09-17 DIAGNOSIS — N400 Enlarged prostate without lower urinary tract symptoms: Secondary | ICD-10-CM | POA: Diagnosis not present

## 2016-09-17 DIAGNOSIS — I351 Nonrheumatic aortic (valve) insufficiency: Secondary | ICD-10-CM | POA: Diagnosis not present

## 2016-09-17 DIAGNOSIS — I340 Nonrheumatic mitral (valve) insufficiency: Secondary | ICD-10-CM

## 2016-09-17 DIAGNOSIS — Z87891 Personal history of nicotine dependence: Secondary | ICD-10-CM | POA: Diagnosis not present

## 2016-09-17 DIAGNOSIS — Z7982 Long term (current) use of aspirin: Secondary | ICD-10-CM | POA: Diagnosis not present

## 2016-09-17 DIAGNOSIS — Z79899 Other long term (current) drug therapy: Secondary | ICD-10-CM | POA: Insufficient documentation

## 2016-09-17 DIAGNOSIS — I700 Atherosclerosis of aorta: Secondary | ICD-10-CM | POA: Insufficient documentation

## 2016-09-17 DIAGNOSIS — Z8673 Personal history of transient ischemic attack (TIA), and cerebral infarction without residual deficits: Secondary | ICD-10-CM | POA: Insufficient documentation

## 2016-09-17 HISTORY — PX: TEE WITHOUT CARDIOVERSION: SHX5443

## 2016-09-17 SURGERY — ECHOCARDIOGRAM, TRANSESOPHAGEAL
Anesthesia: Moderate Sedation

## 2016-09-17 MED ORDER — FENTANYL CITRATE (PF) 100 MCG/2ML IJ SOLN
INTRAMUSCULAR | Status: AC
  Filled 2016-09-17: qty 2

## 2016-09-17 MED ORDER — SODIUM CHLORIDE 0.9 % IV SOLN
INTRAVENOUS | Status: DC
  Administered 2016-09-17: 500 mL via INTRAVENOUS

## 2016-09-17 MED ORDER — MIDAZOLAM HCL 10 MG/2ML IJ SOLN
INTRAMUSCULAR | Status: DC | PRN
  Administered 2016-09-17 (×3): 2 mg via INTRAVENOUS

## 2016-09-17 MED ORDER — BUTAMBEN-TETRACAINE-BENZOCAINE 2-2-14 % EX AERO
INHALATION_SPRAY | CUTANEOUS | Status: DC | PRN
  Administered 2016-09-17: 2 via TOPICAL

## 2016-09-17 MED ORDER — MIDAZOLAM HCL 5 MG/ML IJ SOLN
INTRAMUSCULAR | Status: AC
Start: 2016-09-17 — End: ?
  Filled 2016-09-17: qty 2

## 2016-09-17 MED ORDER — FENTANYL CITRATE (PF) 100 MCG/2ML IJ SOLN
INTRAMUSCULAR | Status: DC | PRN
  Administered 2016-09-17 (×2): 25 ug via INTRAVENOUS

## 2016-09-17 NOTE — Interval H&P Note (Signed)
History and Physical Interval Note:  09/17/2016 10:24 AM  Troy Bentley  has presented today for surgery, with the diagnosis of STROKE  The various methods of treatment have been discussed with the patient and family. After consideration of risks, benefits and other options for treatment, the patient has consented to  Procedure(s): TRANSESOPHAGEAL ECHOCARDIOGRAM (TEE) (N/A) as a surgical intervention .  The patient's history has been reviewed, patient examined, no change in status, stable for surgery.  I have reviewed the patient's chart and labs.  Questions were answered to the patient's satisfaction.     Ron Beske [employer]

## 2016-09-17 NOTE — CV Procedure (Signed)
Procedure: TEE  Indication: CVA  Sedation: Versed 6 mg IV, Fentanyl 50 mcg IV  Findings: Please see echo section for full report.  Normal LV size with mild LV hypertrophy.  EF 55-60% with no regional wall motion abnormalities.  Normal right atrial size and systolic function.  Normal right and left atrial sizes.  No LA appendage thrombus.  There was lipomatous hypertrophy of the interatrial septum.  There was a small PFO, bubble crossing was only noted with valsalva.  Trivial TR, peak RV-RA gradient 20 mmHg.  Mild MR.  Trileaflet, mildly calcified aortic valve with mild aortic insufficiency.  Borderline dilated ascending aorta at 3.7 cm.  Mild plaque in the descending thoracic aorta.   Impression:  Only potential source for embolism noted was a small PFO.   Loralie Champagne 09/17/2016 10:29 AM

## 2016-09-17 NOTE — Patient Outreach (Signed)
[place] [facility]) Care Management  09/17/2016  XZAVIEN HARADA 1946-01-23 [id number]   Received ADT notification of patients hospital admission on today.  Notified [facility] hospital liaison of admission.   PLAN; RNCM will follow up regarding discharge disposition.   Quinn Plowman RN,BSN,CCM [facility] Telephonic  [phone]

## 2016-09-17 NOTE — H&P (View-Only) (Signed)
Cardiology Office Note:    Date:  09/14/2016   ID:  Troy Bentley, DOB 1946/01/07, MRN [id number]  PCP:  Raina Mina., MD  Cardiologist:  Shirlee More, MD    Referring MD: Raina Mina., MD  ASSESSMENT:    1. Cerebrovascular accident (CVA), unspecified mechanism ([place])   2. Pulmonary arteriovenous malformation   3. Hyperlipidemia, unspecified hyperlipidemia type    PLAN:    In order of problems listed above:  1. Advised him undergo further evaluation looking for evidence of right-to-left shunt starting with a transesophageal echocardiogram. If normal he should undergo further evaluation I favor an implanted loop recorder and evaluation for pulmonary AV malformation. In the interim he'll continue lipid-lowering therapy and aspirin for secondary prophylaxis 2. See above 3. Continue his statin  Next appointment: 1 month   Medication Adjustments/Labs and Tests Ordered: Current medicines are reviewed at length with the patient today.  Concerns regarding medicines are outlined above.  Orders Placed This Encounter  Procedures  . TRANSESOPHAGEAL ECHOCARDIOGRAM W/O CARDIOVERSION   No orders of the defined types were placed in this encounter.    Chief Complaint  Patient presents with  . New Patient (Initial Visit)    recently had a stroke, with PFO  . Cerebrovascular Accident  . 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 male who is being seen today for the evaluation of cardiac source of embolic stroke. He had a recent stroke with AMS and seizure like activity with admission to [facility].He is seen at the request of Raina Mina., MD.While hospitalized he was found to have evidence of right-to-left shunt on a contrast echocardiogram raising concern for either PFO or pulmonary AV malformation. His stroke occurred in the setting of aspirin therapy he has previous stroke by MRI multiple cerebellar and  asked him undergo further evaluation starting with the transesophageal echo. If normal I would favor an implanted loop recorder and then evaluation for pulmonary AV malformation with contrast CTA of the lung. He's had no palpitation chest pain syncope. He has no documented history of atrial fibrillation.   Past Medical History:  Diagnosis Date  . BPH (benign prostatic hyperplasia)   . Compression fracture of lumbar vertebra ([place])   . Hernia, umbilical   . Hyperlipemia   . Kidney stone   . Pneumonia     Past Surgical History:  Procedure Laterality Date  . BACK SURGERY  2005  . EYE SURGERY     bilateral cataract surgery  . ORIF CLAVICULAR FRACTURE  04/15/2012   Procedure: OPEN REDUCTION INTERNAL FIXATION (ORIF) CLAVICULAR FRACTURE;  Surgeon: Augustin Schooling, MD;  Location: [place];  Service: Orthopedics;  Laterality: Left;    Current Medications: Current Meds  Medication Sig  . alfuzosin (UROXATRAL) 10 MG 24 hr tablet Take 10 mg by mouth daily after supper.   . Ascorbic Acid (VITAMIN C) 1000 MG tablet Take 1,000 mg by mouth 2 (two) times daily.  [name] aspirin 81 MG tablet Take 81 mg by mouth QID.   [name] Misc Natural Products (URINOZINC PO) Take by mouth. 1 time per day  . Multiple Vitamin (MULTIVITAMIN WITH MINERALS) TABS Take 1 tablet by mouth daily.  . polyethylene glycol (MIRALAX / GLYCOLAX) packet Take 17 g by mouth daily. 1 tablespoon per day  . pravastatin (PRAVACHOL) 80 MG tablet Take 80 mg by mouth daily.  Allergies:   Fenofibrate   Social History   Social History  . Marital status: Married    Spouse name: N/A  . Number of children: N/A  . Years of education: N/A   Social History Main Topics  . Smoking status: Former Research scientist (life sciences)  . Smokeless tobacco: Never Used     Comment: quit 1991  . Alcohol use No  . Drug use: No  . Sexual activity: Not Asked   Other Topics Concern  . None   Social History Narrative  . None     Family History: The patient's family history  includes Hyperlipidemia in his mother. ROS:   Please see the history of present illness.     All other systems reviewed and are negative.  EKGs/Labs/Other Studies Reviewed:    The following studies were reviewed today: TTE at [facility] 08/24/16:SUMMARY  The left ventricular size is normal.  There is mild concentric left ventricular hypertrophy.  Left ventricular systolic function is normal.  LV ejection fraction = 60-65%.  Left ventricular filling pattern is impaired relaxation.  The right ventricle is mildly dilated with normal systolic function.  The left atrial size is normal.  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  There is aortic valve sclerosis.  The aortic valve opens well.  There is no significant valvular stenosis or regurgitation  IVC size was moderately dilated.  There is no pericardial effusion.  There is no comparison study available.   EKG:  EKG is  ordered today.  The ekg ordered today demonstrates a normal EKG-SRTH.  Recent records in [place] reviewed prior to the visit  Physical Exam:    VS:  BP 114/82   Pulse 61   Ht 5\' 11"  (1.803 m)   Wt 214 lb 6.4 oz (97.3 kg)   SpO2 97%   BMI 29.90 kg/m     Wt Readings from Last 3 Encounters:  09/14/16 214 lb 6.4 oz (97.3 kg)  04/15/12 220 lb (99.8 kg)     GEN:  Well nourished, well developed in no acute distress HEENT: Normal NECK: No JVD; No carotid bruits LYMPHATICS: No lymphadenopathy CARDIAC: RRR, no murmurs, rubs, gallops RESPIRATORY:  Clear to auscultation without rales, wheezing or rhonchi  ABDOMEN: Soft, non-tender, non-distended MUSCULOSKELETAL:  No edema; No deformity  SKIN: Warm and dry NEUROLOGIC:  Alert and oriented x 3 PSYCHIATRIC:  Normal affect     Signed, Shirlee More, MD  09/14/2016 3:32 PM    [facility]

## 2016-09-18 ENCOUNTER — Encounter (HOSPITAL_COMMUNITY): Payer: Self-pay | Admitting: Cardiology

## 2016-09-21 ENCOUNTER — Ambulatory Visit: Payer: Self-pay

## 2016-09-21 DIAGNOSIS — R945 Abnormal results of liver function studies: Secondary | ICD-10-CM | POA: Diagnosis not present

## 2016-09-23 ENCOUNTER — Encounter: Payer: Self-pay | Admitting: Cardiology

## 2016-09-23 ENCOUNTER — Ambulatory Visit (INDEPENDENT_AMBULATORY_CARE_PROVIDER_SITE_OTHER): Payer: PPO | Admitting: Cardiology

## 2016-09-23 VITALS — BP 102/68 | HR 68 | Ht 71.0 in | Wt 218.1 lb

## 2016-09-23 DIAGNOSIS — E782 Mixed hyperlipidemia: Secondary | ICD-10-CM | POA: Diagnosis not present

## 2016-09-23 DIAGNOSIS — I639 Cerebral infarction, unspecified: Secondary | ICD-10-CM

## 2016-09-23 NOTE — Progress Notes (Signed)
Electrophysiology Office Note   Date:  09/24/2016   ID:  Troy Bentley, DOB [birth year], MRN [id number]  PCP:  Raina Mina., MD  Cardiologist:  Bettina Gavia Primary Electrophysiologist:  Montie Swiderski Meredith Leeds, MD    Chief Complaint  Patient presents with  . New Patient (Initial Visit)    Cryptogenic stroke     History of Present Illness: Troy Bentley is a 71 y.o. male who is being seen today for the evaluation of CVA at the request of Raina Mina., MD. Presenting today for electrophysiology evaluation. He has a history of stroke and seizures. He was hospitalized and found to have a right to left shunt on contrast echo raising concern for PFO or pulmonary AVM. His stroke occurred in the setting of aspirin therapy. He had a previous stroke by MRI. He had a TEE that showed normal LV function and a small PFO, but no source of embolism.    Today, he denies symptoms of palpitations, chest pain, shortness of breath, orthopnea, PND, lower extremity edema, claudication, dizziness, presyncope, syncope, bleeding, or neurologic sequela. The patient is tolerating medications without difficulties. His stroke symptoms have been improving but, per his wife, he has had less energy since his stroke.   Past Medical History:  Diagnosis Date  . BPH (benign prostatic hyperplasia)   . Compression fracture of lumbar vertebra ([place])   . Hernia, umbilical   . Hyperlipemia   . Kidney stone   . Pneumonia    Past Surgical History:  Procedure Laterality Date  . BACK SURGERY  2005  . EYE SURGERY     bilateral cataract surgery  . ORIF CLAVICULAR FRACTURE  04/15/2012   Procedure: OPEN REDUCTION INTERNAL FIXATION (ORIF) CLAVICULAR FRACTURE;  Surgeon: Augustin Schooling, MD;  Location: [place];  Service: Orthopedics;  Laterality: Left;  . TEE WITHOUT CARDIOVERSION N/A 09/17/2016   Procedure: TRANSESOPHAGEAL ECHOCARDIOGRAM (TEE);  Surgeon: Larey Dresser, MD;  Location: [facility] ENDOSCOPY;  Service: Cardiovascular;   Laterality: N/A;     Current Outpatient Prescriptions  Medication Sig Dispense Refill  . alfuzosin (UROXATRAL) 10 MG 24 hr tablet Take 10 mg by mouth daily after supper.     . Ascorbic Acid (VITAMIN C) 1000 MG tablet Take 1,000 mg by mouth 2 (two) times daily.    [name] aspirin 325 MG tablet Take 325 mg by mouth daily.    . Misc Natural Products (URINOZINC PO) Take by mouth. 1 time per day    . Multiple Vitamin (MULTIVITAMIN WITH MINERALS) TABS Take 1 tablet by mouth daily.    . polyethylene glycol (MIRALAX / GLYCOLAX) packet Take 17 g by mouth daily. 1 tablespoon per day    . pravastatin (PRAVACHOL) 80 MG tablet Take 80 mg by mouth daily.     No current facility-administered medications for this visit.     Allergies:   Fenofibrate   Social History:  The patient  reports that he has quit smoking. He has never used smokeless tobacco. He reports that he does not drink alcohol or use drugs.   Family History:  The patient's family history includes Cancer in his father; Hyperlipidemia in his mother.    ROS:  Please see the history of present illness.   Otherwise, review of systems is positive for none.   All other systems are reviewed and negative.    PHYSICAL EXAM: VS:  BP 102/68   Pulse 68   Ht 5\' 11"  (1.803 m)   Wt 218 lb 1.9  oz (98.9 kg)   BMI 30.42 kg/m  , BMI Body mass index is 30.42 kg/m. GEN: Well nourished, well developed, in no acute distress  HEENT: normal  Neck: no JVD, carotid bruits, or masses Cardiac: RRR; no murmurs, rubs, or gallops,no edema  Respiratory:  clear to auscultation bilaterally, normal work of breathing GI: soft, nontender, nondistended, + BS MS: no deformity or atrophy  Skin: warm and dry Neuro:  Strength and sensation are intact Psych: euthymic mood, full affect  EKG:  EKG is ordered today. Personal review of the ekg ordered shows sinus rhythm, rate 68  Recent Labs: No results found for requested labs within last 8760 hours.    Lipid Panel    No results found for: CHOL, TRIG, HDL, CHOLHDL, VLDL, LDLCALC, LDLDIRECT   Wt Readings from Last 3 Encounters:  09/23/16 218 lb 1.9 oz (98.9 kg)  09/17/16 214 lb (97.1 kg)  09/14/16 214 lb 6.4 oz (97.3 kg)      Other studies Reviewed: Additional studies/ records that were reviewed today include: TEE 09/17/16  Review of the above records today demonstrates:  Normal LV size with mild LV hypertrophy.  EF 55-60% with no regional wall motion abnormalities.  Normal right atrial size and systolic function.  Normal right and left atrial sizes.  No LA appendage thrombus.  There was lipomatous hypertrophy of the interatrial septum.  There was a small PFO, bubble crossing was only noted with valsalva.  Trivial TR, peak RV-RA gradient 20 mmHg.  Mild MR.  Trileaflet, mildly calcified aortic valve with mild aortic insufficiency.  Borderline dilated ascending aorta at 3.7 cm.  Mild plaque in the descending thoracic aorta.    ASSESSMENT AND PLAN:  1.  CVA: Currently on 325 mg of aspirin. TEE showed no evidence of thrombus or potential embolism. It is possible that atrial fibrillation was the initial cause of his strokes. We discussed further monitoring with a 30 day monitor versus Linq monitor. Risks and benefits of Linq implantation were discussed. Risks include bleeding and infection, among others. The patient understands the risks and has agreed to the procedure.  2. Hyperlipidemia: on Pravastatn    Current medicines are reviewed at length with the patient today.   The patient does not have concerns regarding his medicines.  The following changes were made today:  none  Labs/ tests ordered today include:  Orders Placed This Encounter  Procedures  . EKG 12-Lead     Disposition:   FU with Anahy Esh post LINQ month  Signed, Kymari Lollis Meredith Leeds, MD  09/24/2016 7:39 AM     [address] [place] [address] [phone] (office) [phone]  (fax)

## 2016-09-23 NOTE — Patient Instructions (Addendum)
Medication Instructions:    Your physician recommends that you continue on your current medications as directed. Please refer to the Current Medication list given to you today.  - If you need a refill on your cardiac medications before your next appointment, please call your pharmacy.   Labwork:  None ordered  Testing/Procedures:  Your physician recommends that you have a loop recorder inserted.  Follow-Up:  Office will call you to arrange post LINQ implant follow up.  Thank you for choosing [facility]!!   Trinidad Curet, RN [phone]  Any Other Special Instructions Will Be Listed Below (If Applicable).

## 2016-09-24 ENCOUNTER — Other Ambulatory Visit: Payer: Self-pay

## 2016-09-24 NOTE — Patient Outreach (Signed)
[facility]) Care Management  09/24/2016  Troy Bentley 21-Oct-1945 [id number]   Entry for 09/03/16 Transition of care  Week # 1 Referral date: 08/28/16 Referral source: Nurse call center Program: transition of care Insurance: Health team advantage Providers: Senaida Lange - Patram Social support: Lives with wife ( patient has given verbal consent to speak with his wife, Troy Bentley regarding all of his personal health information). Wife request to be the contact for patient. [phone] Attempt #1  Telephone call to patients wife, Troy Bentley.  Unable to reach. HIPAA compliant voice message left with call back phone number.  PLAN:  RNCM will attempt 2nd outreach to patient within  1 week.   Quinn Plowman RN,BSN,CCM [facility] Telephonic  [phone]

## 2016-09-29 ENCOUNTER — Ambulatory Visit: Payer: Self-pay

## 2016-10-06 DIAGNOSIS — I639 Cerebral infarction, unspecified: Secondary | ICD-10-CM | POA: Diagnosis not present

## 2016-10-06 DIAGNOSIS — Z8781 Personal history of (healed) traumatic fracture: Secondary | ICD-10-CM | POA: Diagnosis not present

## 2016-10-06 DIAGNOSIS — D696 Thrombocytopenia, unspecified: Secondary | ICD-10-CM | POA: Diagnosis not present

## 2016-10-06 DIAGNOSIS — Q211 Atrial septal defect: Secondary | ICD-10-CM | POA: Diagnosis not present

## 2016-10-07 ENCOUNTER — Other Ambulatory Visit: Payer: Self-pay

## 2016-10-07 DIAGNOSIS — Z8781 Personal history of (healed) traumatic fracture: Secondary | ICD-10-CM | POA: Insufficient documentation

## 2016-10-07 HISTORY — DX: Personal history of (healed) traumatic fracture: Z87.81

## 2016-10-07 NOTE — Patient Outreach (Signed)
[facility]) Care Management  10/07/2016  Troy Bentley [birth year] [id number]  Entry for 09/03/16 Transition of care  Week # 1 Referral date: 08/28/16 Referral source: Nurse call center Program: transition of care Insurance: Health team advantage Providers: Senaida Lange - Patram Social support: Lives with wife ( patient has given verbal consent to speak with his wife, Pelham Hennick regarding all of his personal health information). Wife request to be the contact for patient. [phone] Attempt #2  Second telephone call to patients wife, Essex Perry.  Unable to reach. HIPAA compliant voice message left with call back phone number.  PLAN:  RNCM will attempt 3rd outreach to patient within 2 weeks.   Quinn Plowman RN,BSN,CCM [facility] Telephonic  [phone]

## 2016-10-08 ENCOUNTER — Encounter (HOSPITAL_COMMUNITY)
Admission: RE | Disposition: A | Discharge status: Home / Self Care | Payer: Self-pay | Source: Ambulatory Visit | Attending: Cardiology

## 2016-10-08 ENCOUNTER — Ambulatory Visit (HOSPITAL_COMMUNITY)
Admission: RE | Admit: 2016-10-08 | Discharge: 2016-10-08 | Disposition: A | Discharge status: Home / Self Care | Payer: PPO | Source: Ambulatory Visit | Attending: Cardiology | Admitting: Cardiology

## 2016-10-08 ENCOUNTER — Encounter (HOSPITAL_COMMUNITY): Payer: Self-pay | Admitting: Cardiology

## 2016-10-08 DIAGNOSIS — I638 Other cerebral infarction: Secondary | ICD-10-CM | POA: Diagnosis not present

## 2016-10-08 DIAGNOSIS — I639 Cerebral infarction, unspecified: Secondary | ICD-10-CM | POA: Diagnosis not present

## 2016-10-08 HISTORY — PX: LOOP RECORDER INSERTION: EP1214

## 2016-10-08 SURGERY — LOOP RECORDER INSERTION
Anesthesia: LOCAL

## 2016-10-08 MED ORDER — LIDOCAINE-EPINEPHRINE 1 %-1:100000 IJ SOLN
INTRAMUSCULAR | Status: AC
  Filled 2016-10-08: qty 1

## 2016-10-08 MED ORDER — LIDOCAINE-EPINEPHRINE 1 %-1:100000 IJ SOLN
INTRAMUSCULAR | Status: DC | PRN
  Administered 2016-10-08: 5 mL

## 2016-10-08 SURGICAL SUPPLY — 2 items
LOOP REVEAL LINQSYS (Prosthesis & Implant Heart) ×2 IMPLANT
PACK LOOP INSERTION (CUSTOM PROCEDURE TRAY) ×2 IMPLANT

## 2016-10-08 NOTE — H&P (Signed)
Troy Bentley is a 71 y.o. male who has a history of cryptogenic stroke. He presents today for implant of LINQ monitor. On exam, regular rhythm, lungs clear, no murmurs. Risks and benefits discussed. Risks include but not limited to bleeding, infection. He understands the risks and has agreed to the procedure.  Will [name], MD 10/08/2016 10:35 AM

## 2016-10-08 NOTE — Discharge Instructions (Signed)
See additional sheet for loop recorder instruction

## 2016-10-12 ENCOUNTER — Other Ambulatory Visit: Payer: Self-pay

## 2016-10-12 NOTE — Patient Outreach (Signed)
[facility]) Care Management  10/12/2016  Troy Bentley 1945/12/07 [id number]  Entry for 09/03/16 Transition of care / Nurse call line follow up Referral date: 08/28/16 Referral source: Nurse call center Program: transition of care Insurance: Health team advantage Providers: Senaida Lange - Patram Social support: Lives with wife ( patient has given verbal consent to speak with his wife, Troy Bentley regarding all of his personal health information). Wife request to be the contact for patient. [phone] Attempt #3  Third telephone attempt to patients wife for patient. Unable to reach. HIPAA compliant voice message left with call back phone.  PLAN:  RNCM will send [facility] outreach letter to patient to attempt contact.   Quinn Plowman RN,BSN,CCM [facility] Telephonic  [phone]

## 2016-10-13 DIAGNOSIS — J189 Pneumonia, unspecified organism: Secondary | ICD-10-CM | POA: Diagnosis not present

## 2016-10-15 ENCOUNTER — Ambulatory Visit (INDEPENDENT_AMBULATORY_CARE_PROVIDER_SITE_OTHER): Payer: Self-pay | Admitting: *Deleted

## 2016-10-15 DIAGNOSIS — I639 Cerebral infarction, unspecified: Secondary | ICD-10-CM

## 2016-10-15 LAB — CUP PACEART INCLINIC DEVICE CHECK
Date Time Interrogation Session: 20180802170722
Implantable Pulse Generator Implant Date: 20180726

## 2016-10-15 NOTE — Progress Notes (Signed)
Wound check appointment. Steri-strips removed. Wound without redness or edema. Incision edges approximated, wound well healed. Normal device function. Battery status: good. R-waves 0.78mV. No symptom, tachy, or AF episodes. 2 pause episodes--false, from implant, pause detection now off. Brady detection off. Monthly summary reports and ROV with WC PRN.

## 2016-10-20 ENCOUNTER — Ambulatory Visit (INDEPENDENT_AMBULATORY_CARE_PROVIDER_SITE_OTHER): Payer: PPO | Admitting: Cardiology

## 2016-10-20 ENCOUNTER — Encounter: Payer: Self-pay | Admitting: Cardiology

## 2016-10-20 VITALS — BP 108/70 | HR 73 | Ht 71.0 in | Wt 225.8 lb

## 2016-10-20 DIAGNOSIS — Q211 Atrial septal defect: Secondary | ICD-10-CM

## 2016-10-20 DIAGNOSIS — Z95818 Presence of other cardiac implants and grafts: Secondary | ICD-10-CM

## 2016-10-20 DIAGNOSIS — Q2112 Patent foramen ovale: Secondary | ICD-10-CM

## 2016-10-20 HISTORY — DX: Presence of other cardiac implants and grafts: Z95.818

## 2016-10-20 NOTE — Patient Instructions (Signed)

## 2016-10-20 NOTE — Progress Notes (Signed)
Cardiology Office Note:    Date:  10/20/2016   ID:  Troy Bentley, DOB 31-Dec-1945, MRN [id number]  PCP:  Raina Mina., MD  Cardiologist:  Shirlee More, MD    Referring MD: Raina Mina., MD    ASSESSMENT:    1. PFO (patent foramen ovale)   2. Status post placement of implantable loop recorder    PLAN:    In order of problems listed above:  1. Small felt to be clinically insignificant TEE reported and reviewed 2. Stable continue to monitor if he has documented atrial fibrillation requiring anticoagulation. In the interim he'll continue aspirin.   Next appointment: 3 months   Medication Adjustments/Labs and Tests Ordered: Current medicines are reviewed at length with the patient today.  Concerns regarding medicines are outlined above.  No orders of the defined types were placed in this encounter.  No orders of the defined types were placed in this encounter.   Chief Complaint  Patient presents with  . Follow-up    1 month flup     History of Present Illness:    Troy Bentley is a 71 y.o. male with a hx of a small PFO and multiple CVA with an ILR to evaluate re cardiac source last seen 1 month ago.He continues to make a slow steady recovery and is due for follow-up with neurology at [place]. He's had no documented arrhythmia or symptomatic palpitation.. Compliance with diet, lifestyle and medications: Yes Past Medical History:  Diagnosis Date  . BPH (benign prostatic hyperplasia)   . Compression fracture of lumbar vertebra ([place])   . Hernia, umbilical   . Hyperlipemia   . Kidney stone   . Pneumonia     Past Surgical History:  Procedure Laterality Date  . BACK SURGERY  2005  . EYE SURGERY     bilateral cataract surgery  . LOOP RECORDER INSERTION N/A 10/08/2016   Procedure: Loop Recorder Insertion;  Surgeon: Constance Haw, MD;  Location: [place] CV LAB;  Service: Cardiovascular;  Laterality: N/A;  . ORIF CLAVICULAR FRACTURE   04/15/2012   Procedure: OPEN REDUCTION INTERNAL FIXATION (ORIF) CLAVICULAR FRACTURE;  Surgeon: Augustin Schooling, MD;  Location: [place];  Service: Orthopedics;  Laterality: Left;  . TEE WITHOUT CARDIOVERSION N/A 09/17/2016   Procedure: TRANSESOPHAGEAL ECHOCARDIOGRAM (TEE);  Surgeon: Larey Dresser, MD;  Location: [facility] ENDOSCOPY;  Service: Cardiovascular;  Laterality: N/A;    Current Medications: Current Meds  Medication Sig  . alfuzosin (UROXATRAL) 10 MG 24 hr tablet Take 10 mg by mouth daily after supper.   . Ascorbic Acid (VITAMIN C) 1000 MG tablet Take 1,000 mg by mouth daily.   [name] aspirin 325 MG tablet Take 325 mg by mouth daily.  . Camphor-Eucalyptus-Menthol (VICKS VAPORUB EX) Apply 1 application topically every other day. Apply to affected toe after bathing  . Multiple Vitamin (MULTIVITAMIN WITH MINERALS) TABS Take 1 tablet by mouth daily.  . Multiple Vitamins-Minerals (MULTIVITAMIN) LIQD Take 20 mLs by mouth daily. Sea Aloe multivitamin liquid  . polyethylene glycol (MIRALAX / GLYCOLAX) packet Take 17 g by mouth daily.   . pravastatin (PRAVACHOL) 80 MG tablet Take 80 mg by mouth at bedtime.      Allergies:   Fenofibrate   Social History   Social History  . Marital status: Married    Spouse name: N/A  . Number of children: N/A  . Years of education: N/A   Social History Main Topics  . Smoking status: Former  Smoker  . Smokeless tobacco: Never Used     Comment: quit 1991  . Alcohol use No  . Drug use: No  . Sexual activity: Not Asked   Other Topics Concern  . None   Social History Narrative  . None     Family History: The patient's family history includes Cancer in his father; Hyperlipidemia in his mother. ROS:   Please see the history of present illness.    All other systems reviewed and are negative.  EKGs/Labs/Other Studies Reviewed:    The following studies were reviewed today:  Recent Labs: No results found for requested labs within last 8760 hours.  Recent  Lipid Panel No results found for: CHOL, TRIG, HDL, CHOLHDL, VLDL, LDLCALC, LDLDIRECT  Physical Exam:    VS:  BP 108/70 (BP Location: Right Arm, Patient Position: Sitting)   Pulse 73   Ht 5\' 11"  (1.803 m)   Wt 225 lb 12.8 oz (102.4 kg)   SpO2 95%   BMI 31.49 kg/m     Wt Readings from Last 3 Encounters:  10/20/16 225 lb 12.8 oz (102.4 kg)  10/08/16 217 lb (98.4 kg)  09/23/16 218 lb 1.9 oz (98.9 kg)     GEN:  Well nourished, well developed in no acute distress HEENT: Normal NECK: No JVD; No carotid bruits LYMPHATICS: No lymphadenopathy CARDIAC: RRR, no murmurs, rubs, gallops RESPIRATORY:  Clear to auscultation without rales, wheezing or rhonchi  ABDOMEN: Soft, non-tender, non-distended MUSCULOSKELETAL:  No edema; No deformity  SKIN: Warm and dry NEUROLOGIC:  Alert and oriented x 3 PSYCHIATRIC:  Normal affect    Signed, Shirlee More, MD  10/20/2016 4:01 PM    [facility]

## 2016-10-21 DIAGNOSIS — M6281 Muscle weakness (generalized): Secondary | ICD-10-CM | POA: Diagnosis not present

## 2016-10-21 DIAGNOSIS — R293 Abnormal posture: Secondary | ICD-10-CM | POA: Diagnosis not present

## 2016-10-21 DIAGNOSIS — Z8673 Personal history of transient ischemic attack (TIA), and cerebral infarction without residual deficits: Secondary | ICD-10-CM | POA: Diagnosis not present

## 2016-10-21 DIAGNOSIS — M2560 Stiffness of unspecified joint, not elsewhere classified: Secondary | ICD-10-CM | POA: Diagnosis not present

## 2016-10-21 DIAGNOSIS — R5383 Other fatigue: Secondary | ICD-10-CM | POA: Diagnosis not present

## 2016-10-21 DIAGNOSIS — I639 Cerebral infarction, unspecified: Secondary | ICD-10-CM | POA: Diagnosis not present

## 2016-10-21 DIAGNOSIS — M545 Low back pain: Secondary | ICD-10-CM | POA: Diagnosis not present

## 2016-10-26 ENCOUNTER — Other Ambulatory Visit: Payer: Self-pay

## 2016-10-26 NOTE — Patient Outreach (Signed)
[facility]) Care Management  10/26/2016  Troy Bentley 01-11-1946 [id number]   CASE CLOSURE Transition of care  Referral date: 08/28/16 Referral source: Nurse call center Program: transition of care Insurance: Health team advantage Providers: Senaida Lange - Patram Social support: Lives with wife ( patient has given verbal consent to speak with his wife, Nashawn Hillock regarding all of his personal health information). Wife request to be the contact for patient. [phone]  No response from patient after 3 telephone calls and outreach letter attempt.   PLAN:  RNCM will refer patient to care management assistant to close due to being unable to contact patient.  RNCM will notify patients primary MD of closure.  RNCM will send patient closure letter.   Quinn Plowman RN,BSN,CCM [facility] Telephonic  [phone]

## 2016-10-29 DIAGNOSIS — I700 Atherosclerosis of aorta: Secondary | ICD-10-CM | POA: Diagnosis not present

## 2016-10-29 DIAGNOSIS — R0602 Shortness of breath: Secondary | ICD-10-CM | POA: Diagnosis not present

## 2016-10-29 DIAGNOSIS — I2699 Other pulmonary embolism without acute cor pulmonale: Secondary | ICD-10-CM | POA: Diagnosis not present

## 2016-10-29 DIAGNOSIS — Z87891 Personal history of nicotine dependence: Secondary | ICD-10-CM | POA: Diagnosis not present

## 2016-10-29 DIAGNOSIS — N182 Chronic kidney disease, stage 2 (mild): Secondary | ICD-10-CM | POA: Diagnosis not present

## 2016-10-29 DIAGNOSIS — Z8673 Personal history of transient ischemic attack (TIA), and cerebral infarction without residual deficits: Secondary | ICD-10-CM | POA: Diagnosis not present

## 2016-10-29 DIAGNOSIS — E1122 Type 2 diabetes mellitus with diabetic chronic kidney disease: Secondary | ICD-10-CM | POA: Diagnosis not present

## 2016-10-29 DIAGNOSIS — R001 Bradycardia, unspecified: Secondary | ICD-10-CM | POA: Diagnosis not present

## 2016-10-29 DIAGNOSIS — J690 Pneumonitis due to inhalation of food and vomit: Secondary | ICD-10-CM | POA: Diagnosis not present

## 2016-11-05 DIAGNOSIS — R0602 Shortness of breath: Secondary | ICD-10-CM | POA: Diagnosis not present

## 2016-11-05 DIAGNOSIS — I724 Aneurysm of artery of lower extremity: Secondary | ICD-10-CM

## 2016-11-05 HISTORY — DX: Aneurysm of artery of lower extremity: I72.4

## 2016-11-09 ENCOUNTER — Ambulatory Visit (INDEPENDENT_AMBULATORY_CARE_PROVIDER_SITE_OTHER): Payer: PPO | Admitting: *Deleted

## 2016-11-09 DIAGNOSIS — I639 Cerebral infarction, unspecified: Secondary | ICD-10-CM

## 2016-11-09 NOTE — Progress Notes (Signed)
Carelink Summary Report / Loop Recorder 

## 2016-11-11 DIAGNOSIS — I724 Aneurysm of artery of lower extremity: Secondary | ICD-10-CM | POA: Diagnosis not present

## 2016-11-11 DIAGNOSIS — I2782 Chronic pulmonary embolism: Secondary | ICD-10-CM | POA: Diagnosis not present

## 2016-11-11 DIAGNOSIS — D696 Thrombocytopenia, unspecified: Secondary | ICD-10-CM | POA: Diagnosis not present

## 2016-11-11 DIAGNOSIS — Z86711 Personal history of pulmonary embolism: Secondary | ICD-10-CM | POA: Insufficient documentation

## 2016-11-11 DIAGNOSIS — I639 Cerebral infarction, unspecified: Secondary | ICD-10-CM | POA: Diagnosis not present

## 2016-11-11 HISTORY — DX: Personal history of pulmonary embolism: Z86.711

## 2016-11-13 LAB — CUP PACEART REMOTE DEVICE CHECK
Date Time Interrogation Session: 20180825162835
Implantable Pulse Generator Implant Date: 20180726

## 2016-11-17 DIAGNOSIS — M2560 Stiffness of unspecified joint, not elsewhere classified: Secondary | ICD-10-CM | POA: Diagnosis not present

## 2016-11-17 DIAGNOSIS — R293 Abnormal posture: Secondary | ICD-10-CM | POA: Diagnosis not present

## 2016-11-17 DIAGNOSIS — R5383 Other fatigue: Secondary | ICD-10-CM | POA: Diagnosis not present

## 2016-11-17 DIAGNOSIS — Z8673 Personal history of transient ischemic attack (TIA), and cerebral infarction without residual deficits: Secondary | ICD-10-CM | POA: Diagnosis not present

## 2016-11-17 DIAGNOSIS — M6281 Muscle weakness (generalized): Secondary | ICD-10-CM | POA: Diagnosis not present

## 2016-11-17 DIAGNOSIS — M545 Low back pain: Secondary | ICD-10-CM | POA: Diagnosis not present

## 2016-11-23 DIAGNOSIS — I639 Cerebral infarction, unspecified: Secondary | ICD-10-CM | POA: Diagnosis not present

## 2016-11-23 DIAGNOSIS — Z8673 Personal history of transient ischemic attack (TIA), and cerebral infarction without residual deficits: Secondary | ICD-10-CM | POA: Diagnosis not present

## 2016-11-23 DIAGNOSIS — F800 Phonological disorder: Secondary | ICD-10-CM | POA: Diagnosis not present

## 2016-11-23 DIAGNOSIS — Z5189 Encounter for other specified aftercare: Secondary | ICD-10-CM | POA: Diagnosis not present

## 2016-11-23 DIAGNOSIS — E782 Mixed hyperlipidemia: Secondary | ICD-10-CM | POA: Diagnosis not present

## 2016-11-23 DIAGNOSIS — I2699 Other pulmonary embolism without acute cor pulmonale: Secondary | ICD-10-CM | POA: Diagnosis not present

## 2016-11-24 DIAGNOSIS — Z7901 Long term (current) use of anticoagulants: Secondary | ICD-10-CM | POA: Diagnosis not present

## 2016-11-24 DIAGNOSIS — I2699 Other pulmonary embolism without acute cor pulmonale: Secondary | ICD-10-CM | POA: Diagnosis not present

## 2016-11-26 DIAGNOSIS — I724 Aneurysm of artery of lower extremity: Secondary | ICD-10-CM | POA: Diagnosis not present

## 2016-11-26 DIAGNOSIS — D696 Thrombocytopenia, unspecified: Secondary | ICD-10-CM | POA: Diagnosis not present

## 2016-11-26 DIAGNOSIS — Z8781 Personal history of (healed) traumatic fracture: Secondary | ICD-10-CM | POA: Diagnosis not present

## 2016-11-26 DIAGNOSIS — N401 Enlarged prostate with lower urinary tract symptoms: Secondary | ICD-10-CM | POA: Diagnosis not present

## 2016-11-26 DIAGNOSIS — I639 Cerebral infarction, unspecified: Secondary | ICD-10-CM | POA: Diagnosis not present

## 2016-11-26 DIAGNOSIS — R7301 Impaired fasting glucose: Secondary | ICD-10-CM | POA: Diagnosis not present

## 2016-11-26 DIAGNOSIS — M150 Primary generalized (osteo)arthritis: Secondary | ICD-10-CM | POA: Diagnosis not present

## 2016-11-26 DIAGNOSIS — R972 Elevated prostate specific antigen [PSA]: Secondary | ICD-10-CM | POA: Diagnosis not present

## 2016-12-01 DIAGNOSIS — R0601 Orthopnea: Secondary | ICD-10-CM | POA: Diagnosis not present

## 2016-12-01 DIAGNOSIS — R0600 Dyspnea, unspecified: Secondary | ICD-10-CM | POA: Diagnosis not present

## 2016-12-01 DIAGNOSIS — R0602 Shortness of breath: Secondary | ICD-10-CM | POA: Diagnosis not present

## 2016-12-07 ENCOUNTER — Ambulatory Visit (INDEPENDENT_AMBULATORY_CARE_PROVIDER_SITE_OTHER): Payer: PPO | Admitting: *Deleted

## 2016-12-07 DIAGNOSIS — I639 Cerebral infarction, unspecified: Secondary | ICD-10-CM | POA: Diagnosis not present

## 2016-12-07 NOTE — Progress Notes (Signed)
Carelink Summary Report / Loop Recorder 

## 2016-12-08 LAB — CUP PACEART REMOTE DEVICE CHECK
Date Time Interrogation Session: 20180924163602
Implantable Pulse Generator Implant Date: 20180726

## 2016-12-10 DIAGNOSIS — M47816 Spondylosis without myelopathy or radiculopathy, lumbar region: Secondary | ICD-10-CM | POA: Diagnosis not present

## 2016-12-10 DIAGNOSIS — I2699 Other pulmonary embolism without acute cor pulmonale: Secondary | ICD-10-CM | POA: Diagnosis not present

## 2016-12-10 DIAGNOSIS — Z7901 Long term (current) use of anticoagulants: Secondary | ICD-10-CM | POA: Diagnosis not present

## 2016-12-15 DIAGNOSIS — Z8673 Personal history of transient ischemic attack (TIA), and cerebral infarction without residual deficits: Secondary | ICD-10-CM | POA: Diagnosis not present

## 2016-12-15 DIAGNOSIS — M2560 Stiffness of unspecified joint, not elsewhere classified: Secondary | ICD-10-CM | POA: Diagnosis not present

## 2016-12-15 DIAGNOSIS — R293 Abnormal posture: Secondary | ICD-10-CM | POA: Diagnosis not present

## 2016-12-15 DIAGNOSIS — M545 Low back pain: Secondary | ICD-10-CM | POA: Diagnosis not present

## 2016-12-15 DIAGNOSIS — R5383 Other fatigue: Secondary | ICD-10-CM | POA: Diagnosis not present

## 2016-12-15 DIAGNOSIS — I69928 Other speech and language deficits following unspecified cerebrovascular disease: Secondary | ICD-10-CM | POA: Diagnosis not present

## 2016-12-15 DIAGNOSIS — M6281 Muscle weakness (generalized): Secondary | ICD-10-CM | POA: Diagnosis not present

## 2016-12-16 DIAGNOSIS — L570 Actinic keratosis: Secondary | ICD-10-CM | POA: Diagnosis not present

## 2016-12-16 DIAGNOSIS — L853 Xerosis cutis: Secondary | ICD-10-CM | POA: Diagnosis not present

## 2016-12-16 DIAGNOSIS — L814 Other melanin hyperpigmentation: Secondary | ICD-10-CM | POA: Diagnosis not present

## 2017-01-05 DIAGNOSIS — R471 Dysarthria and anarthria: Secondary | ICD-10-CM | POA: Diagnosis not present

## 2017-01-05 DIAGNOSIS — I639 Cerebral infarction, unspecified: Secondary | ICD-10-CM | POA: Diagnosis not present

## 2017-01-06 ENCOUNTER — Ambulatory Visit (INDEPENDENT_AMBULATORY_CARE_PROVIDER_SITE_OTHER): Payer: PPO | Admitting: *Deleted

## 2017-01-06 DIAGNOSIS — I639 Cerebral infarction, unspecified: Secondary | ICD-10-CM | POA: Diagnosis not present

## 2017-01-06 NOTE — Progress Notes (Signed)
Carelink Summary Report / Loop Recorder 

## 2017-01-07 LAB — CUP PACEART REMOTE DEVICE CHECK
Date Time Interrogation Session: 20181024163815
Implantable Pulse Generator Implant Date: 20180726

## 2017-01-11 DIAGNOSIS — I2782 Chronic pulmonary embolism: Secondary | ICD-10-CM | POA: Diagnosis not present

## 2017-01-11 DIAGNOSIS — I639 Cerebral infarction, unspecified: Secondary | ICD-10-CM | POA: Diagnosis not present

## 2017-01-11 DIAGNOSIS — I724 Aneurysm of artery of lower extremity: Secondary | ICD-10-CM | POA: Diagnosis not present

## 2017-01-11 DIAGNOSIS — Q211 Atrial septal defect: Secondary | ICD-10-CM | POA: Diagnosis not present

## 2017-01-28 DIAGNOSIS — I639 Cerebral infarction, unspecified: Secondary | ICD-10-CM | POA: Diagnosis not present

## 2017-01-28 DIAGNOSIS — R278 Other lack of coordination: Secondary | ICD-10-CM | POA: Diagnosis not present

## 2017-01-28 DIAGNOSIS — M6281 Muscle weakness (generalized): Secondary | ICD-10-CM | POA: Diagnosis not present

## 2017-02-02 DIAGNOSIS — I639 Cerebral infarction, unspecified: Secondary | ICD-10-CM | POA: Diagnosis not present

## 2017-02-02 DIAGNOSIS — M6281 Muscle weakness (generalized): Secondary | ICD-10-CM | POA: Diagnosis not present

## 2017-02-02 DIAGNOSIS — R278 Other lack of coordination: Secondary | ICD-10-CM | POA: Diagnosis not present

## 2017-02-08 ENCOUNTER — Ambulatory Visit (INDEPENDENT_AMBULATORY_CARE_PROVIDER_SITE_OTHER): Payer: PPO | Admitting: *Deleted

## 2017-02-08 DIAGNOSIS — I639 Cerebral infarction, unspecified: Secondary | ICD-10-CM

## 2017-02-08 NOTE — Progress Notes (Signed)
Carelink Summary Report / Loop Recorder 

## 2017-02-23 LAB — CUP PACEART REMOTE DEVICE CHECK
Date Time Interrogation Session: 20181123170740
Implantable Pulse Generator Implant Date: 20180726

## 2017-03-03 DIAGNOSIS — Q211 Atrial septal defect: Secondary | ICD-10-CM | POA: Diagnosis not present

## 2017-03-03 DIAGNOSIS — I724 Aneurysm of artery of lower extremity: Secondary | ICD-10-CM | POA: Diagnosis not present

## 2017-03-03 DIAGNOSIS — D72819 Decreased white blood cell count, unspecified: Secondary | ICD-10-CM | POA: Diagnosis not present

## 2017-03-03 DIAGNOSIS — Z0000 Encounter for general adult medical examination without abnormal findings: Secondary | ICD-10-CM | POA: Diagnosis not present

## 2017-03-03 DIAGNOSIS — Z95818 Presence of other cardiac implants and grafts: Secondary | ICD-10-CM | POA: Diagnosis not present

## 2017-03-03 DIAGNOSIS — I639 Cerebral infarction, unspecified: Secondary | ICD-10-CM | POA: Diagnosis not present

## 2017-03-08 ENCOUNTER — Ambulatory Visit (INDEPENDENT_AMBULATORY_CARE_PROVIDER_SITE_OTHER): Payer: PPO | Admitting: *Deleted

## 2017-03-08 DIAGNOSIS — I639 Cerebral infarction, unspecified: Secondary | ICD-10-CM

## 2017-03-10 NOTE — Progress Notes (Signed)
Carelink Summary Report / Loop Recorder 

## 2017-03-11 DIAGNOSIS — D6861 Antiphospholipid syndrome: Secondary | ICD-10-CM | POA: Diagnosis not present

## 2017-03-11 DIAGNOSIS — I2699 Other pulmonary embolism without acute cor pulmonale: Secondary | ICD-10-CM | POA: Diagnosis not present

## 2017-03-11 DIAGNOSIS — Z7901 Long term (current) use of anticoagulants: Secondary | ICD-10-CM | POA: Diagnosis not present

## 2017-03-11 DIAGNOSIS — Z86711 Personal history of pulmonary embolism: Secondary | ICD-10-CM | POA: Diagnosis not present

## 2017-03-18 LAB — CUP PACEART REMOTE DEVICE CHECK
Date Time Interrogation Session: 20181223170957
Implantable Pulse Generator Implant Date: 20180726

## 2017-03-23 DIAGNOSIS — R7301 Impaired fasting glucose: Secondary | ICD-10-CM | POA: Diagnosis not present

## 2017-03-23 DIAGNOSIS — L600 Ingrowing nail: Secondary | ICD-10-CM | POA: Insufficient documentation

## 2017-03-23 DIAGNOSIS — J329 Chronic sinusitis, unspecified: Secondary | ICD-10-CM | POA: Diagnosis not present

## 2017-03-23 DIAGNOSIS — E782 Mixed hyperlipidemia: Secondary | ICD-10-CM | POA: Diagnosis not present

## 2017-03-23 DIAGNOSIS — D696 Thrombocytopenia, unspecified: Secondary | ICD-10-CM | POA: Diagnosis not present

## 2017-03-23 DIAGNOSIS — J3489 Other specified disorders of nose and nasal sinuses: Secondary | ICD-10-CM | POA: Diagnosis not present

## 2017-03-23 HISTORY — DX: Ingrowing nail: L60.0

## 2017-03-29 DIAGNOSIS — J3489 Other specified disorders of nose and nasal sinuses: Secondary | ICD-10-CM

## 2017-03-29 HISTORY — DX: Other specified disorders of nose and nasal sinuses: J34.89

## 2017-04-06 ENCOUNTER — Ambulatory Visit (INDEPENDENT_AMBULATORY_CARE_PROVIDER_SITE_OTHER): Payer: PPO | Admitting: *Deleted

## 2017-04-06 DIAGNOSIS — I639 Cerebral infarction, unspecified: Secondary | ICD-10-CM | POA: Diagnosis not present

## 2017-04-07 NOTE — Progress Notes (Signed)
Carelink Summary Report / Loop Recorder 

## 2017-04-13 DIAGNOSIS — J342 Deviated nasal septum: Secondary | ICD-10-CM | POA: Diagnosis not present

## 2017-04-13 DIAGNOSIS — J343 Hypertrophy of nasal turbinates: Secondary | ICD-10-CM | POA: Diagnosis not present

## 2017-04-13 DIAGNOSIS — Z8673 Personal history of transient ischemic attack (TIA), and cerebral infarction without residual deficits: Secondary | ICD-10-CM | POA: Diagnosis not present

## 2017-04-13 DIAGNOSIS — J324 Chronic pansinusitis: Secondary | ICD-10-CM | POA: Diagnosis not present

## 2017-04-13 DIAGNOSIS — R0981 Nasal congestion: Secondary | ICD-10-CM | POA: Diagnosis not present

## 2017-04-13 DIAGNOSIS — J3489 Other specified disorders of nose and nasal sinuses: Secondary | ICD-10-CM | POA: Diagnosis not present

## 2017-04-15 LAB — CUP PACEART REMOTE DEVICE CHECK
Date Time Interrogation Session: 20190122174114
Implantable Pulse Generator Implant Date: 20180726

## 2017-05-10 ENCOUNTER — Ambulatory Visit (INDEPENDENT_AMBULATORY_CARE_PROVIDER_SITE_OTHER): Payer: PPO | Admitting: *Deleted

## 2017-05-10 DIAGNOSIS — I639 Cerebral infarction, unspecified: Secondary | ICD-10-CM | POA: Diagnosis not present

## 2017-05-10 NOTE — Progress Notes (Signed)
Carelink Summary Report / Loop Recorder 

## 2017-05-13 DIAGNOSIS — J343 Hypertrophy of nasal turbinates: Secondary | ICD-10-CM | POA: Diagnosis not present

## 2017-05-13 DIAGNOSIS — J3489 Other specified disorders of nose and nasal sinuses: Secondary | ICD-10-CM | POA: Diagnosis not present

## 2017-05-13 DIAGNOSIS — R0981 Nasal congestion: Secondary | ICD-10-CM | POA: Diagnosis not present

## 2017-05-13 DIAGNOSIS — J342 Deviated nasal septum: Secondary | ICD-10-CM | POA: Diagnosis not present

## 2017-05-25 DIAGNOSIS — I724 Aneurysm of artery of lower extremity: Secondary | ICD-10-CM | POA: Diagnosis not present

## 2017-05-25 DIAGNOSIS — I639 Cerebral infarction, unspecified: Secondary | ICD-10-CM | POA: Diagnosis not present

## 2017-05-26 DIAGNOSIS — N401 Enlarged prostate with lower urinary tract symptoms: Secondary | ICD-10-CM | POA: Diagnosis not present

## 2017-05-26 DIAGNOSIS — R972 Elevated prostate specific antigen [PSA]: Secondary | ICD-10-CM | POA: Diagnosis not present

## 2017-05-31 DIAGNOSIS — I2782 Chronic pulmonary embolism: Secondary | ICD-10-CM | POA: Diagnosis not present

## 2017-05-31 DIAGNOSIS — I724 Aneurysm of artery of lower extremity: Secondary | ICD-10-CM | POA: Diagnosis not present

## 2017-05-31 DIAGNOSIS — Q211 Atrial septal defect: Secondary | ICD-10-CM | POA: Diagnosis not present

## 2017-05-31 DIAGNOSIS — I639 Cerebral infarction, unspecified: Secondary | ICD-10-CM | POA: Diagnosis not present

## 2017-06-02 DIAGNOSIS — I639 Cerebral infarction, unspecified: Secondary | ICD-10-CM | POA: Diagnosis not present

## 2017-06-02 DIAGNOSIS — E785 Hyperlipidemia, unspecified: Secondary | ICD-10-CM | POA: Diagnosis not present

## 2017-06-02 DIAGNOSIS — G4733 Obstructive sleep apnea (adult) (pediatric): Secondary | ICD-10-CM | POA: Diagnosis not present

## 2017-06-02 DIAGNOSIS — Z79899 Other long term (current) drug therapy: Secondary | ICD-10-CM | POA: Diagnosis not present

## 2017-06-02 DIAGNOSIS — Z87891 Personal history of nicotine dependence: Secondary | ICD-10-CM | POA: Diagnosis not present

## 2017-06-02 DIAGNOSIS — R0602 Shortness of breath: Secondary | ICD-10-CM | POA: Diagnosis not present

## 2017-06-02 DIAGNOSIS — D6861 Antiphospholipid syndrome: Secondary | ICD-10-CM | POA: Diagnosis not present

## 2017-06-02 DIAGNOSIS — Z7901 Long term (current) use of anticoagulants: Secondary | ICD-10-CM | POA: Diagnosis not present

## 2017-06-02 DIAGNOSIS — Z7982 Long term (current) use of aspirin: Secondary | ICD-10-CM | POA: Diagnosis not present

## 2017-06-02 DIAGNOSIS — Z86711 Personal history of pulmonary embolism: Secondary | ICD-10-CM | POA: Diagnosis not present

## 2017-06-02 DIAGNOSIS — I69392 Facial weakness following cerebral infarction: Secondary | ICD-10-CM | POA: Diagnosis not present

## 2017-06-08 DIAGNOSIS — D1801 Hemangioma of skin and subcutaneous tissue: Secondary | ICD-10-CM | POA: Diagnosis not present

## 2017-06-08 DIAGNOSIS — D485 Neoplasm of uncertain behavior of skin: Secondary | ICD-10-CM | POA: Diagnosis not present

## 2017-06-08 DIAGNOSIS — D225 Melanocytic nevi of trunk: Secondary | ICD-10-CM | POA: Diagnosis not present

## 2017-06-08 DIAGNOSIS — L821 Other seborrheic keratosis: Secondary | ICD-10-CM | POA: Diagnosis not present

## 2017-06-08 DIAGNOSIS — D2239 Melanocytic nevi of other parts of face: Secondary | ICD-10-CM | POA: Diagnosis not present

## 2017-06-11 ENCOUNTER — Ambulatory Visit (INDEPENDENT_AMBULATORY_CARE_PROVIDER_SITE_OTHER): Payer: PPO | Admitting: *Deleted

## 2017-06-11 DIAGNOSIS — I639 Cerebral infarction, unspecified: Secondary | ICD-10-CM

## 2017-06-14 NOTE — Progress Notes (Signed)
Carelink Summary Report / Loop Recorder 

## 2017-06-15 LAB — CUP PACEART REMOTE DEVICE CHECK
Date Time Interrogation Session: 20190224191150
Implantable Pulse Generator Implant Date: 20180726

## 2017-06-22 DIAGNOSIS — S76111A Strain of right quadriceps muscle, fascia and tendon, initial encounter: Secondary | ICD-10-CM | POA: Diagnosis not present

## 2017-06-25 ENCOUNTER — Encounter: Payer: Self-pay | Admitting: Gastroenterology

## 2017-07-06 DIAGNOSIS — I724 Aneurysm of artery of lower extremity: Secondary | ICD-10-CM | POA: Diagnosis not present

## 2017-07-08 DIAGNOSIS — Q211 Atrial septal defect: Secondary | ICD-10-CM | POA: Diagnosis not present

## 2017-07-08 DIAGNOSIS — D6861 Antiphospholipid syndrome: Secondary | ICD-10-CM | POA: Insufficient documentation

## 2017-07-08 DIAGNOSIS — R4189 Other symptoms and signs involving cognitive functions and awareness: Secondary | ICD-10-CM

## 2017-07-08 DIAGNOSIS — Z86711 Personal history of pulmonary embolism: Secondary | ICD-10-CM | POA: Diagnosis not present

## 2017-07-08 DIAGNOSIS — I639 Cerebral infarction, unspecified: Secondary | ICD-10-CM | POA: Diagnosis not present

## 2017-07-08 DIAGNOSIS — I724 Aneurysm of artery of lower extremity: Secondary | ICD-10-CM | POA: Diagnosis not present

## 2017-07-08 HISTORY — DX: Antiphospholipid syndrome: D68.61

## 2017-07-08 HISTORY — DX: Other symptoms and signs involving cognitive functions and awareness: R41.89

## 2017-07-14 ENCOUNTER — Ambulatory Visit (INDEPENDENT_AMBULATORY_CARE_PROVIDER_SITE_OTHER): Payer: PPO | Admitting: *Deleted

## 2017-07-14 DIAGNOSIS — I639 Cerebral infarction, unspecified: Secondary | ICD-10-CM | POA: Diagnosis not present

## 2017-07-15 NOTE — Progress Notes (Signed)
Carelink Summary Report / Loop Recorder 

## 2017-07-16 LAB — CUP PACEART REMOTE DEVICE CHECK
Date Time Interrogation Session: 20190329194043
Implantable Pulse Generator Implant Date: 20180726

## 2017-08-09 LAB — CUP PACEART REMOTE DEVICE CHECK
Date Time Interrogation Session: 20190501193735
Implantable Pulse Generator Implant Date: 20180726

## 2017-08-16 ENCOUNTER — Ambulatory Visit (INDEPENDENT_AMBULATORY_CARE_PROVIDER_SITE_OTHER): Payer: PPO | Admitting: *Deleted

## 2017-08-16 DIAGNOSIS — I639 Cerebral infarction, unspecified: Secondary | ICD-10-CM

## 2017-08-18 NOTE — Progress Notes (Signed)
Carelink Summary Report / Loop Recorder 

## 2017-08-24 DIAGNOSIS — I639 Cerebral infarction, unspecified: Secondary | ICD-10-CM | POA: Diagnosis not present

## 2017-08-24 DIAGNOSIS — G4733 Obstructive sleep apnea (adult) (pediatric): Secondary | ICD-10-CM | POA: Diagnosis not present

## 2017-09-20 ENCOUNTER — Ambulatory Visit (INDEPENDENT_AMBULATORY_CARE_PROVIDER_SITE_OTHER): Payer: PPO | Admitting: *Deleted

## 2017-09-20 DIAGNOSIS — I639 Cerebral infarction, unspecified: Secondary | ICD-10-CM

## 2017-09-20 NOTE — Progress Notes (Signed)
Carelink Summary Report / Loop Recorder 

## 2017-09-22 LAB — CUP PACEART REMOTE DEVICE CHECK
Date Time Interrogation Session: 20190603200722
Implantable Pulse Generator Implant Date: 20180726

## 2017-09-28 DIAGNOSIS — L570 Actinic keratosis: Secondary | ICD-10-CM | POA: Diagnosis not present

## 2017-10-20 DIAGNOSIS — Z8673 Personal history of transient ischemic attack (TIA), and cerebral infarction without residual deficits: Secondary | ICD-10-CM | POA: Diagnosis not present

## 2017-10-20 DIAGNOSIS — R4189 Other symptoms and signs involving cognitive functions and awareness: Secondary | ICD-10-CM | POA: Diagnosis not present

## 2017-10-20 DIAGNOSIS — M4722 Other spondylosis with radiculopathy, cervical region: Secondary | ICD-10-CM | POA: Diagnosis not present

## 2017-10-21 ENCOUNTER — Ambulatory Visit (INDEPENDENT_AMBULATORY_CARE_PROVIDER_SITE_OTHER): Payer: PPO | Admitting: *Deleted

## 2017-10-21 DIAGNOSIS — I639 Cerebral infarction, unspecified: Secondary | ICD-10-CM

## 2017-10-22 DIAGNOSIS — M542 Cervicalgia: Secondary | ICD-10-CM | POA: Diagnosis not present

## 2017-10-22 DIAGNOSIS — M4722 Other spondylosis with radiculopathy, cervical region: Secondary | ICD-10-CM | POA: Diagnosis not present

## 2017-10-22 NOTE — Progress Notes (Signed)
Carelink Summary Report / Loop Recorder 

## 2017-10-23 LAB — CUP PACEART REMOTE DEVICE CHECK
Date Time Interrogation Session: 20190706200725
Implantable Pulse Generator Implant Date: 20180726

## 2017-10-28 DIAGNOSIS — M4722 Other spondylosis with radiculopathy, cervical region: Secondary | ICD-10-CM | POA: Diagnosis not present

## 2017-10-28 DIAGNOSIS — M25611 Stiffness of right shoulder, not elsewhere classified: Secondary | ICD-10-CM | POA: Diagnosis not present

## 2017-10-28 DIAGNOSIS — M2560 Stiffness of unspecified joint, not elsewhere classified: Secondary | ICD-10-CM | POA: Diagnosis not present

## 2017-10-28 DIAGNOSIS — R293 Abnormal posture: Secondary | ICD-10-CM | POA: Diagnosis not present

## 2017-10-28 DIAGNOSIS — M542 Cervicalgia: Secondary | ICD-10-CM | POA: Diagnosis not present

## 2017-10-28 DIAGNOSIS — M25612 Stiffness of left shoulder, not elsewhere classified: Secondary | ICD-10-CM | POA: Diagnosis not present

## 2017-11-11 DIAGNOSIS — G4733 Obstructive sleep apnea (adult) (pediatric): Secondary | ICD-10-CM | POA: Diagnosis not present

## 2017-11-11 DIAGNOSIS — Z8673 Personal history of transient ischemic attack (TIA), and cerebral infarction without residual deficits: Secondary | ICD-10-CM | POA: Diagnosis not present

## 2017-11-11 DIAGNOSIS — G4730 Sleep apnea, unspecified: Secondary | ICD-10-CM | POA: Diagnosis not present

## 2017-11-16 DIAGNOSIS — M4722 Other spondylosis with radiculopathy, cervical region: Secondary | ICD-10-CM | POA: Diagnosis not present

## 2017-11-23 ENCOUNTER — Ambulatory Visit (INDEPENDENT_AMBULATORY_CARE_PROVIDER_SITE_OTHER): Payer: PPO | Admitting: *Deleted

## 2017-11-23 DIAGNOSIS — I639 Cerebral infarction, unspecified: Secondary | ICD-10-CM

## 2017-11-24 NOTE — Progress Notes (Signed)
Carelink Summary Report / Loop Recorder 

## 2017-11-29 DIAGNOSIS — N401 Enlarged prostate with lower urinary tract symptoms: Secondary | ICD-10-CM | POA: Diagnosis not present

## 2017-11-29 DIAGNOSIS — R498 Other voice and resonance disorders: Secondary | ICD-10-CM | POA: Diagnosis not present

## 2017-11-29 DIAGNOSIS — R471 Dysarthria and anarthria: Secondary | ICD-10-CM | POA: Diagnosis not present

## 2017-11-29 DIAGNOSIS — R972 Elevated prostate specific antigen [PSA]: Secondary | ICD-10-CM | POA: Diagnosis not present

## 2017-11-29 DIAGNOSIS — Z8673 Personal history of transient ischemic attack (TIA), and cerebral infarction without residual deficits: Secondary | ICD-10-CM | POA: Diagnosis not present

## 2017-12-01 LAB — CUP PACEART REMOTE DEVICE CHECK
Date Time Interrogation Session: 20190808200941
Implantable Pulse Generator Implant Date: 20180726

## 2017-12-10 LAB — CUP PACEART REMOTE DEVICE CHECK
Date Time Interrogation Session: 20190910204021
Implantable Pulse Generator Implant Date: 20180726

## 2017-12-20 DIAGNOSIS — Z8673 Personal history of transient ischemic attack (TIA), and cerebral infarction without residual deficits: Secondary | ICD-10-CM | POA: Diagnosis not present

## 2017-12-20 DIAGNOSIS — R471 Dysarthria and anarthria: Secondary | ICD-10-CM | POA: Diagnosis not present

## 2017-12-22 DIAGNOSIS — I639 Cerebral infarction, unspecified: Secondary | ICD-10-CM | POA: Diagnosis not present

## 2017-12-27 ENCOUNTER — Ambulatory Visit (INDEPENDENT_AMBULATORY_CARE_PROVIDER_SITE_OTHER): Payer: PPO | Admitting: *Deleted

## 2017-12-27 DIAGNOSIS — I639 Cerebral infarction, unspecified: Secondary | ICD-10-CM | POA: Diagnosis not present

## 2017-12-28 DIAGNOSIS — R4189 Other symptoms and signs involving cognitive functions and awareness: Secondary | ICD-10-CM | POA: Diagnosis not present

## 2017-12-28 DIAGNOSIS — Z8673 Personal history of transient ischemic attack (TIA), and cerebral infarction without residual deficits: Secondary | ICD-10-CM | POA: Diagnosis not present

## 2017-12-28 DIAGNOSIS — G4733 Obstructive sleep apnea (adult) (pediatric): Secondary | ICD-10-CM | POA: Insufficient documentation

## 2017-12-28 DIAGNOSIS — Z23 Encounter for immunization: Secondary | ICD-10-CM | POA: Diagnosis not present

## 2017-12-28 DIAGNOSIS — Z86711 Personal history of pulmonary embolism: Secondary | ICD-10-CM | POA: Diagnosis not present

## 2017-12-28 HISTORY — DX: Obstructive sleep apnea (adult) (pediatric): G47.33

## 2017-12-28 NOTE — Progress Notes (Signed)
Carelink Summary Report / Loop Recorder 

## 2017-12-29 ENCOUNTER — Telehealth: Payer: Self-pay | Admitting: Cardiology

## 2017-12-29 NOTE — Telephone Encounter (Signed)
Patient has reached the Donut hole with Xarelto na wants to know if we have samples. Please call Pat.

## 2017-12-29 NOTE — Telephone Encounter (Signed)
Patient's wife, Fraser Din, called wanting samples of xarelto. Patient has not been seen by Dr. Bettina Gavia since 10/20/2016 and we have no record that patient is taking xarelto. Fraser Din explained that the patient had a stroke in June 2018 and the hospital started him on xarelto. His PCP, Dr. Bea Graff, has been refilling it ever since and they do not give samples. Beecher Mcardle that since he is overdue for an appointment and we have no documentation that he is currently taking xarelto, samples cannot be provided. Patient is scheduled to see Dr. Bettina Gavia on 01/12/18. Advised her to bring all the patient's current medications and we can provide samples at this time. Pat verbalized understanding. No further questions.

## 2018-01-03 DIAGNOSIS — R471 Dysarthria and anarthria: Secondary | ICD-10-CM | POA: Insufficient documentation

## 2018-01-03 HISTORY — DX: Dysarthria and anarthria: R47.1

## 2018-01-10 LAB — CUP PACEART REMOTE DEVICE CHECK
Date Time Interrogation Session: 20191013203937
Implantable Pulse Generator Implant Date: 20180726

## 2018-01-12 ENCOUNTER — Encounter: Payer: Self-pay | Admitting: Cardiology

## 2018-01-12 ENCOUNTER — Ambulatory Visit (INDEPENDENT_AMBULATORY_CARE_PROVIDER_SITE_OTHER): Payer: PPO | Admitting: Cardiology

## 2018-01-12 VITALS — BP 100/68 | HR 59 | Ht 71.0 in | Wt 234.0 lb

## 2018-01-12 DIAGNOSIS — Z8673 Personal history of transient ischemic attack (TIA), and cerebral infarction without residual deficits: Secondary | ICD-10-CM

## 2018-01-12 DIAGNOSIS — G4733 Obstructive sleep apnea (adult) (pediatric): Secondary | ICD-10-CM | POA: Diagnosis not present

## 2018-01-12 DIAGNOSIS — I724 Aneurysm of artery of lower extremity: Secondary | ICD-10-CM

## 2018-01-12 DIAGNOSIS — Z7901 Long term (current) use of anticoagulants: Secondary | ICD-10-CM

## 2018-01-12 HISTORY — DX: Long term (current) use of anticoagulants: Z79.01

## 2018-01-12 MED ORDER — XARELTO 20 MG PO TABS: ORAL_TABLET | ORAL | 3 refills | Status: DC

## 2018-01-12 NOTE — Patient Instructions (Signed)

## 2018-01-12 NOTE — Progress Notes (Signed)
Cardiology Office Note:    Date:  01/12/2018   ID:  Troy Bentley, DOB 1946/02/28, MRN [id number]  PCP:  Raina Mina., MD  Cardiologist:  Shirlee More, MD    Referring MD: Raina Mina., MD    ASSESSMENT:    1. 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 Chronic anticoagulation   4. Obstructive sleep apnea syndrome    PLAN:    In order of problems listed above:  1. He has had no recurrence on his current treatment has implanted loop recorder no atrial fibrillation documented he is anticoagulated because of pulmonary embolism August 2018 and I would continue him on long-term anticoagulation as he has a patent foramen ovale.  I reviewed this with the patient the risk and benefits of both he and his wife agree. 2. 2 cm left femoral artery followed by vascular surgery [facility] 3. Continue his anticoagulant 4. He is awaiting his CPAP trial and has fatigue and shortness of breath related.   Next appointment: One year   Medication Adjustments/Labs and Tests Ordered: Current medicines are reviewed at length with the patient today.  Concerns regarding medicines are outlined above.  Orders Placed This Encounter  Procedures  . EKG 12-Lead   Meds ordered this encounter  Medications  . XARELTO 20 MG TABS tablet    Sig: Take 1 tablet (20 mg) daily with supper.    Dispense:  90 tablet    Refill:  3    Chief Complaint  Patient presents with  . Follow-up    with an ILR    History of Present Illness:    Troy Bentley is a 72 y.o. male with a hx of small PFO and multiple CVA with an ILR to evaluate re cardiac source  last seen 10/20/16.  His loop recorder downloads that showed no evidence of atrial fibrillation.  He had TEE performed last summer which showed a very small PFO not felt to require closure Compliance with diet, lifestyle and medications: Yes  I reviewed his loop recorder downloads and he has had no atrial  fibrillation.  He is struggling with untreated sleep apnea with fatigue and shortness of breath when he supine with pharyngeal weakness from his stroke.  No chest pain edema exertional shortness of breath syncope or TIA he remains anticoagulated after pulmonary embolism and I think is the appropriate antithrombotic treatment. Past Medical History:  Diagnosis Date  . BPH (benign prostatic hyperplasia)   . Compression fracture of lumbar vertebra ([place])   . Hernia, umbilical   . Hyperlipemia   . Kidney stone   . Pneumonia   . Stroke [facility])     Past Surgical History:  Procedure Laterality Date  . BACK SURGERY  2005  . EYE SURGERY     bilateral cataract surgery  . LOOP RECORDER INSERTION N/A 10/08/2016   Procedure: Loop Recorder Insertion;  Surgeon: Constance Haw, MD;  Location: [place] CV LAB;  Service: Cardiovascular;  Laterality: N/A;  . ORIF CLAVICULAR FRACTURE  04/15/2012   Procedure: OPEN REDUCTION INTERNAL FIXATION (ORIF) CLAVICULAR FRACTURE;  Surgeon: Augustin Schooling, MD;  Location: [place];  Service: Orthopedics;  Laterality: Left;  . TEE WITHOUT CARDIOVERSION N/A 09/17/2016   Procedure: TRANSESOPHAGEAL ECHOCARDIOGRAM (TEE);  Surgeon: Larey Dresser, MD;  Location: [facility] ENDOSCOPY;  Service: Cardiovascular;  Laterality: N/A;    Current Medications: Current Meds  Medication Sig  . alfuzosin (UROXATRAL) 10  MG 24 hr tablet Take 10 mg by mouth daily after supper.   . Ascorbic Acid (VITAMIN C) 1000 MG tablet Take 1,000 mg by mouth daily.   . finasteride (PROSCAR) 5 MG tablet TK 1 T PO D  . Multiple Vitamin (MULTIVITAMIN WITH MINERALS) TABS Take 1 tablet by mouth daily.  . Multiple Vitamins-Minerals (MULTIVITAMIN) LIQD Take 20 mLs by mouth daily. Sea Aloe multivitamin liquid  . polyethylene glycol (MIRALAX / GLYCOLAX) packet Take 17 g by mouth daily.   . pravastatin (PRAVACHOL) 80 MG tablet Take 80 mg by mouth at bedtime.   Alveda Reasons 20 MG TABS tablet Take 1 tablet (20 mg) daily  with supper.  . [DISCONTINUED] XARELTO 20 MG TABS tablet TK 1 T PO   WITH DINNER     Allergies:   Fenofibrate   Social History   Socioeconomic History  . Marital status: Married    Spouse name: Not on file  . Number of children: Not on file  . Years of education: Not on file  . Highest education level: Not on file  Occupational History  . Not on file  Social Needs  . Financial resource strain: Not on file  . Food insecurity:    Worry: Not on file    Inability: Not on file  . Transportation needs:    Medical: Not on file    Non-medical: Not on file  Tobacco Use  . Smoking status: Former Research scientist (life sciences)  . Smokeless tobacco: Never Used  . Tobacco comment: quit 1991  Substance and Sexual Activity  . Alcohol use: No  . Drug use: No  . Sexual activity: Not on file  Lifestyle  . Physical activity:    Days per week: Not on file    Minutes per session: Not on file  . Stress: Not on file  Relationships  . Social connections:    Talks on phone: Not on file    Gets together: Not on file    Attends religious service: Not on file    Active member of club or organization: Not on file    Attends meetings of clubs or organizations: Not on file    Relationship status: Not on file  Other Topics Concern  . Not on file  Social History Narrative  . Not on file     Family History: The patient's family history includes Cancer in his father; Hyperlipidemia in his mother. ROS:   Please see the history of present illness.    All other systems reviewed and are negative.  EKGs/Labs/Other Studies Reviewed:    The following studies were reviewed today:  EKG:  EKG ordered today.  The ekg ordered today demonstrates sinus rhythm normal  Recent Labs:   03/23/2017 CMP normal except for bilirubin 1.5 cholesterol 191 LDL 44 HDL 46 TSH normal No results found for requested labs within last 8760 hours.  Recent Lipid Panel No results found for: CHOL, TRIG, HDL, CHOLHDL, VLDL, LDLCALC,  LDLDIRECT  Physical Exam:    VS:  BP 100/68 (BP Location: Right Arm, Patient Position: Sitting, Cuff Size: Large)   Pulse (!) 59   Ht 5\' 11"  (1.803 m)   Wt 234 lb (106.1 kg)   SpO2 94%   BMI 32.64 kg/m     Wt Readings from Last 3 Encounters:  01/12/18 234 lb (106.1 kg)  10/20/16 225 lb 12.8 oz (102.4 kg)  10/08/16 217 lb (98.4 kg)     GEN:  Well nourished, well developed in no acute  distress HEENT: Normal NECK: No JVD; No carotid bruits LYMPHATICS: No lymphadenopathy CARDIAC: RRR, no murmurs, rubs, gallops RESPIRATORY:  Clear to auscultation without rales, wheezing or rhonchi  ABDOMEN: Soft, non-tender, non-distended MUSCULOSKELETAL:  No edema; No deformity  SKIN: Warm and dry NEUROLOGIC:  Alert and oriented x 3 PSYCHIATRIC:  Normal affect    Signed, Shirlee More, MD  01/12/2018 12:23 PM    [facility]

## 2018-01-28 ENCOUNTER — Ambulatory Visit (INDEPENDENT_AMBULATORY_CARE_PROVIDER_SITE_OTHER): Payer: PPO | Admitting: *Deleted

## 2018-01-28 DIAGNOSIS — I639 Cerebral infarction, unspecified: Secondary | ICD-10-CM | POA: Diagnosis not present

## 2018-01-28 NOTE — Progress Notes (Signed)
Carelink Summary Report / Loop Recorder 

## 2018-02-28 ENCOUNTER — Telehealth: Payer: Self-pay | Admitting: Cardiology

## 2018-03-02 ENCOUNTER — Ambulatory Visit (INDEPENDENT_AMBULATORY_CARE_PROVIDER_SITE_OTHER): Payer: PPO

## 2018-03-02 DIAGNOSIS — Z7901 Long term (current) use of anticoagulants: Secondary | ICD-10-CM | POA: Diagnosis not present

## 2018-03-02 DIAGNOSIS — D6861 Antiphospholipid syndrome: Secondary | ICD-10-CM | POA: Diagnosis not present

## 2018-03-02 DIAGNOSIS — I639 Cerebral infarction, unspecified: Secondary | ICD-10-CM | POA: Diagnosis not present

## 2018-03-02 DIAGNOSIS — Z888 Allergy status to other drugs, medicaments and biological substances status: Secondary | ICD-10-CM | POA: Diagnosis not present

## 2018-03-02 DIAGNOSIS — Z09 Encounter for follow-up examination after completed treatment for conditions other than malignant neoplasm: Secondary | ICD-10-CM | POA: Diagnosis not present

## 2018-03-02 DIAGNOSIS — I724 Aneurysm of artery of lower extremity: Secondary | ICD-10-CM | POA: Diagnosis not present

## 2018-03-02 DIAGNOSIS — E785 Hyperlipidemia, unspecified: Secondary | ICD-10-CM | POA: Diagnosis not present

## 2018-03-02 DIAGNOSIS — Z8673 Personal history of transient ischemic attack (TIA), and cerebral infarction without residual deficits: Secondary | ICD-10-CM | POA: Diagnosis not present

## 2018-03-02 DIAGNOSIS — Z86711 Personal history of pulmonary embolism: Secondary | ICD-10-CM | POA: Diagnosis not present

## 2018-03-02 DIAGNOSIS — Q211 Atrial septal defect: Secondary | ICD-10-CM | POA: Diagnosis not present

## 2018-03-03 NOTE — Progress Notes (Signed)
Carelink Summary Report / Loop Recorder 

## 2018-03-17 DIAGNOSIS — I69311 Memory deficit following cerebral infarction: Secondary | ICD-10-CM | POA: Diagnosis not present

## 2018-03-17 DIAGNOSIS — E7800 Pure hypercholesterolemia, unspecified: Secondary | ICD-10-CM | POA: Diagnosis not present

## 2018-03-17 DIAGNOSIS — E1122 Type 2 diabetes mellitus with diabetic chronic kidney disease: Secondary | ICD-10-CM | POA: Diagnosis not present

## 2018-03-20 LAB — CUP PACEART REMOTE DEVICE CHECK
Date Time Interrogation Session: 20191115204034
Implantable Pulse Generator Implant Date: 20180726

## 2018-03-21 DIAGNOSIS — N400 Enlarged prostate without lower urinary tract symptoms: Secondary | ICD-10-CM | POA: Diagnosis not present

## 2018-03-21 DIAGNOSIS — Z8673 Personal history of transient ischemic attack (TIA), and cerebral infarction without residual deficits: Secondary | ICD-10-CM | POA: Diagnosis not present

## 2018-03-21 DIAGNOSIS — G4733 Obstructive sleep apnea (adult) (pediatric): Secondary | ICD-10-CM | POA: Diagnosis not present

## 2018-03-21 DIAGNOSIS — Z86711 Personal history of pulmonary embolism: Secondary | ICD-10-CM | POA: Diagnosis not present

## 2018-03-21 DIAGNOSIS — D696 Thrombocytopenia, unspecified: Secondary | ICD-10-CM | POA: Diagnosis not present

## 2018-03-21 DIAGNOSIS — R7301 Impaired fasting glucose: Secondary | ICD-10-CM | POA: Diagnosis not present

## 2018-03-21 DIAGNOSIS — D6861 Antiphospholipid syndrome: Secondary | ICD-10-CM | POA: Diagnosis not present

## 2018-03-21 DIAGNOSIS — Z8546 Personal history of malignant neoplasm of prostate: Secondary | ICD-10-CM

## 2018-03-21 DIAGNOSIS — Z0000 Encounter for general adult medical examination without abnormal findings: Secondary | ICD-10-CM | POA: Diagnosis not present

## 2018-03-21 DIAGNOSIS — R4189 Other symptoms and signs involving cognitive functions and awareness: Secondary | ICD-10-CM | POA: Diagnosis not present

## 2018-03-21 DIAGNOSIS — I724 Aneurysm of artery of lower extremity: Secondary | ICD-10-CM | POA: Diagnosis not present

## 2018-03-21 DIAGNOSIS — E782 Mixed hyperlipidemia: Secondary | ICD-10-CM | POA: Diagnosis not present

## 2018-03-21 HISTORY — DX: Personal history of malignant neoplasm of prostate: Z85.46

## 2018-03-22 DIAGNOSIS — R7301 Impaired fasting glucose: Secondary | ICD-10-CM | POA: Diagnosis not present

## 2018-03-22 DIAGNOSIS — E785 Hyperlipidemia, unspecified: Secondary | ICD-10-CM | POA: Diagnosis not present

## 2018-03-23 DIAGNOSIS — Z712 Person consulting for explanation of examination or test findings: Secondary | ICD-10-CM | POA: Diagnosis not present

## 2018-03-23 DIAGNOSIS — I69315 Cognitive social or emotional deficit following cerebral infarction: Secondary | ICD-10-CM | POA: Diagnosis not present

## 2018-03-26 LAB — CUP PACEART REMOTE DEVICE CHECK
Date Time Interrogation Session: 20191218224103
Implantable Pulse Generator Implant Date: 20180726

## 2018-04-04 ENCOUNTER — Ambulatory Visit (INDEPENDENT_AMBULATORY_CARE_PROVIDER_SITE_OTHER): Payer: PPO

## 2018-04-04 DIAGNOSIS — I639 Cerebral infarction, unspecified: Secondary | ICD-10-CM | POA: Diagnosis not present

## 2018-04-05 LAB — CUP PACEART REMOTE DEVICE CHECK
Date Time Interrogation Session: 20200120230926
Implantable Pulse Generator Implant Date: 20180726

## 2018-04-05 NOTE — Progress Notes (Signed)
Carelink Summary Report / Loop Recorder 

## 2018-04-13 NOTE — Telephone Encounter (Signed)
error 

## 2018-04-18 DIAGNOSIS — J00 Acute nasopharyngitis [common cold]: Secondary | ICD-10-CM | POA: Diagnosis not present

## 2018-04-18 DIAGNOSIS — Z79899 Other long term (current) drug therapy: Secondary | ICD-10-CM | POA: Diagnosis not present

## 2018-04-18 DIAGNOSIS — R2990 Unspecified symptoms and signs involving the nervous system: Secondary | ICD-10-CM | POA: Diagnosis not present

## 2018-04-18 DIAGNOSIS — H532 Diplopia: Secondary | ICD-10-CM | POA: Diagnosis not present

## 2018-04-18 DIAGNOSIS — Z8679 Personal history of other diseases of the circulatory system: Secondary | ICD-10-CM | POA: Diagnosis not present

## 2018-04-18 DIAGNOSIS — Z87891 Personal history of nicotine dependence: Secondary | ICD-10-CM | POA: Diagnosis not present

## 2018-04-18 DIAGNOSIS — H538 Other visual disturbances: Secondary | ICD-10-CM | POA: Diagnosis not present

## 2018-04-18 DIAGNOSIS — Z8673 Personal history of transient ischemic attack (TIA), and cerebral infarction without residual deficits: Secondary | ICD-10-CM | POA: Diagnosis not present

## 2018-04-18 DIAGNOSIS — I4891 Unspecified atrial fibrillation: Secondary | ICD-10-CM | POA: Diagnosis not present

## 2018-04-18 DIAGNOSIS — I69392 Facial weakness following cerebral infarction: Secondary | ICD-10-CM | POA: Diagnosis not present

## 2018-04-18 DIAGNOSIS — Z7901 Long term (current) use of anticoagulants: Secondary | ICD-10-CM | POA: Diagnosis not present

## 2018-04-18 DIAGNOSIS — R001 Bradycardia, unspecified: Secondary | ICD-10-CM | POA: Diagnosis not present

## 2018-04-20 DIAGNOSIS — H903 Sensorineural hearing loss, bilateral: Secondary | ICD-10-CM | POA: Diagnosis not present

## 2018-04-21 ENCOUNTER — Other Ambulatory Visit: Payer: Self-pay

## 2018-04-21 DIAGNOSIS — H4922 Sixth [abducent] nerve palsy, left eye: Secondary | ICD-10-CM | POA: Diagnosis not present

## 2018-04-21 DIAGNOSIS — L601 Onycholysis: Secondary | ICD-10-CM | POA: Diagnosis not present

## 2018-04-21 DIAGNOSIS — L600 Ingrowing nail: Secondary | ICD-10-CM

## 2018-04-21 HISTORY — DX: Ingrowing nail: L60.0

## 2018-04-21 NOTE — Patient Outreach (Signed)
[facility]) Care Management  04/21/2018  Troy Bentley [birth year] [id number]   Patient discharged from [facility] 04/19/2018.  Referral received. No outreach warranted at this time. Transition of Care  will be completed by primary care provider office who will refer to [facility] care management if needed.  Plan: RN CM will close case.  Jone Baseman, RN, MSN [facility] Care Management Coordinator Direct Line [phone] Cell [phone] Toll Free: [phone]  Fax: [phone]

## 2018-04-24 DIAGNOSIS — Z9989 Dependence on other enabling machines and devices: Secondary | ICD-10-CM | POA: Diagnosis not present

## 2018-04-24 DIAGNOSIS — G4733 Obstructive sleep apnea (adult) (pediatric): Secondary | ICD-10-CM | POA: Diagnosis not present

## 2018-05-02 DIAGNOSIS — E041 Nontoxic single thyroid nodule: Secondary | ICD-10-CM

## 2018-05-02 DIAGNOSIS — H4922 Sixth [abducent] nerve palsy, left eye: Secondary | ICD-10-CM

## 2018-05-02 HISTORY — DX: Nontoxic single thyroid nodule: E04.1

## 2018-05-02 HISTORY — DX: Sixth (abducent) nerve palsy, left eye: H49.22

## 2018-05-09 ENCOUNTER — Ambulatory Visit (INDEPENDENT_AMBULATORY_CARE_PROVIDER_SITE_OTHER): Payer: PPO | Admitting: *Deleted

## 2018-05-09 DIAGNOSIS — I639 Cerebral infarction, unspecified: Secondary | ICD-10-CM | POA: Diagnosis not present

## 2018-05-10 DIAGNOSIS — G4733 Obstructive sleep apnea (adult) (pediatric): Secondary | ICD-10-CM | POA: Diagnosis not present

## 2018-05-10 DIAGNOSIS — R269 Unspecified abnormalities of gait and mobility: Secondary | ICD-10-CM | POA: Diagnosis not present

## 2018-05-10 LAB — CUP PACEART REMOTE DEVICE CHECK
Date Time Interrogation Session: 20200222230952
Implantable Pulse Generator Implant Date: 20180726

## 2018-05-11 DIAGNOSIS — E041 Nontoxic single thyroid nodule: Secondary | ICD-10-CM | POA: Diagnosis not present

## 2018-05-16 DIAGNOSIS — I639 Cerebral infarction, unspecified: Secondary | ICD-10-CM | POA: Diagnosis not present

## 2018-05-16 DIAGNOSIS — J302 Other seasonal allergic rhinitis: Secondary | ICD-10-CM | POA: Diagnosis not present

## 2018-05-16 DIAGNOSIS — E669 Obesity, unspecified: Secondary | ICD-10-CM | POA: Diagnosis not present

## 2018-05-16 DIAGNOSIS — J342 Deviated nasal septum: Secondary | ICD-10-CM | POA: Diagnosis not present

## 2018-05-16 DIAGNOSIS — G4733 Obstructive sleep apnea (adult) (pediatric): Secondary | ICD-10-CM | POA: Diagnosis not present

## 2018-05-16 DIAGNOSIS — Z8673 Personal history of transient ischemic attack (TIA), and cerebral infarction without residual deficits: Secondary | ICD-10-CM | POA: Diagnosis not present

## 2018-05-16 DIAGNOSIS — Z6836 Body mass index (BMI) 36.0-36.9, adult: Secondary | ICD-10-CM | POA: Diagnosis not present

## 2018-05-16 NOTE — Progress Notes (Signed)
Carelink Summary Report / Loop Recorder 

## 2018-05-19 DIAGNOSIS — H4923 Sixth [abducent] nerve palsy, bilateral: Secondary | ICD-10-CM | POA: Diagnosis not present

## 2018-05-19 DIAGNOSIS — H43812 Vitreous degeneration, left eye: Secondary | ICD-10-CM | POA: Diagnosis not present

## 2018-05-21 DIAGNOSIS — H4922 Sixth [abducent] nerve palsy, left eye: Secondary | ICD-10-CM | POA: Diagnosis not present

## 2018-05-21 DIAGNOSIS — Z79899 Other long term (current) drug therapy: Secondary | ICD-10-CM | POA: Diagnosis not present

## 2018-05-21 DIAGNOSIS — I340 Nonrheumatic mitral (valve) insufficiency: Secondary | ICD-10-CM | POA: Diagnosis not present

## 2018-05-21 DIAGNOSIS — S3991XA Unspecified injury of abdomen, initial encounter: Secondary | ICD-10-CM | POA: Diagnosis not present

## 2018-05-21 DIAGNOSIS — S199XXA Unspecified injury of neck, initial encounter: Secondary | ICD-10-CM | POA: Diagnosis not present

## 2018-05-21 DIAGNOSIS — R1013 Epigastric pain: Secondary | ICD-10-CM | POA: Diagnosis not present

## 2018-05-21 DIAGNOSIS — I77810 Thoracic aortic ectasia: Secondary | ICD-10-CM | POA: Diagnosis not present

## 2018-05-21 DIAGNOSIS — S0990XA Unspecified injury of head, initial encounter: Secondary | ICD-10-CM | POA: Diagnosis not present

## 2018-05-21 DIAGNOSIS — R109 Unspecified abdominal pain: Secondary | ICD-10-CM | POA: Diagnosis not present

## 2018-05-21 DIAGNOSIS — Z7901 Long term (current) use of anticoagulants: Secondary | ICD-10-CM | POA: Diagnosis not present

## 2018-05-21 DIAGNOSIS — E785 Hyperlipidemia, unspecified: Secondary | ICD-10-CM | POA: Diagnosis not present

## 2018-05-21 DIAGNOSIS — S0993XA Unspecified injury of face, initial encounter: Secondary | ICD-10-CM | POA: Diagnosis not present

## 2018-05-21 DIAGNOSIS — S299XXA Unspecified injury of thorax, initial encounter: Secondary | ICD-10-CM | POA: Diagnosis not present

## 2018-05-21 DIAGNOSIS — G4733 Obstructive sleep apnea (adult) (pediatric): Secondary | ICD-10-CM | POA: Diagnosis not present

## 2018-05-21 DIAGNOSIS — R112 Nausea with vomiting, unspecified: Secondary | ICD-10-CM

## 2018-05-21 DIAGNOSIS — D6861 Antiphospholipid syndrome: Secondary | ICD-10-CM | POA: Diagnosis not present

## 2018-05-21 DIAGNOSIS — R55 Syncope and collapse: Secondary | ICD-10-CM | POA: Diagnosis not present

## 2018-05-21 DIAGNOSIS — Z86711 Personal history of pulmonary embolism: Secondary | ICD-10-CM | POA: Diagnosis not present

## 2018-05-21 DIAGNOSIS — Z8546 Personal history of malignant neoplasm of prostate: Secondary | ICD-10-CM | POA: Diagnosis not present

## 2018-05-21 DIAGNOSIS — N400 Enlarged prostate without lower urinary tract symptoms: Secondary | ICD-10-CM | POA: Diagnosis not present

## 2018-05-21 DIAGNOSIS — Z8673 Personal history of transient ischemic attack (TIA), and cerebral infarction without residual deficits: Secondary | ICD-10-CM | POA: Diagnosis not present

## 2018-05-22 DIAGNOSIS — R112 Nausea with vomiting, unspecified: Secondary | ICD-10-CM | POA: Diagnosis not present

## 2018-05-22 DIAGNOSIS — R55 Syncope and collapse: Secondary | ICD-10-CM | POA: Diagnosis not present

## 2018-05-22 DIAGNOSIS — S299XXA Unspecified injury of thorax, initial encounter: Secondary | ICD-10-CM | POA: Diagnosis not present

## 2018-05-22 DIAGNOSIS — R079 Chest pain, unspecified: Secondary | ICD-10-CM | POA: Diagnosis not present

## 2018-05-24 DIAGNOSIS — H1132 Conjunctival hemorrhage, left eye: Secondary | ICD-10-CM | POA: Diagnosis not present

## 2018-05-30 DIAGNOSIS — N401 Enlarged prostate with lower urinary tract symptoms: Secondary | ICD-10-CM | POA: Diagnosis not present

## 2018-05-30 DIAGNOSIS — R339 Retention of urine, unspecified: Secondary | ICD-10-CM | POA: Diagnosis not present

## 2018-05-30 DIAGNOSIS — R972 Elevated prostate specific antigen [PSA]: Secondary | ICD-10-CM | POA: Diagnosis not present

## 2018-06-01 DIAGNOSIS — H4922 Sixth [abducent] nerve palsy, left eye: Secondary | ICD-10-CM | POA: Diagnosis not present

## 2018-06-01 DIAGNOSIS — Z87898 Personal history of other specified conditions: Secondary | ICD-10-CM | POA: Diagnosis not present

## 2018-06-01 DIAGNOSIS — Z8673 Personal history of transient ischemic attack (TIA), and cerebral infarction without residual deficits: Secondary | ICD-10-CM | POA: Diagnosis not present

## 2018-06-03 ENCOUNTER — Telehealth: Payer: Self-pay | Admitting: Cardiology

## 2018-06-03 NOTE — Telephone Encounter (Signed)
Returned call to [employer]. She reports patient was seen at [facility] on 3/7 for nausea, vomiting, and syncope. Fraser Din reports they were told that pt's syncope was due to dehydration and volume depletion (see notes in CE). She was told by PCP to f/u with Dr. Bettina Gavia and to request results from patient's loop recorder.   Explained no true tachy or AF episodes noted on LINQ thus far, 1 tachy episode from 2/3 is false--ECG shows EMI, likely correlates with MRI performed at [facility] that day. Pause and brady detection are off per cryptogenic stroke protocol. Advised I will route this message to Dr. [name] to see if he recommends bringing pt in to reprogram pause/brady detection on. HR histogram included below.  Pt's wife requests to schedule an appointment with Dr. Bettina Gavia. Explained current policy regarding non-urgent appointments, but will also forward this message to Dr. Bettina Gavia and Barnetta Chapel, RN for further advisement.

## 2018-06-03 NOTE — Telephone Encounter (Signed)
Dr. Sharyon Cable RN -- Please inform pt of LINQ result, noted by device clinic, with your tele-visit next week.  Thanks

## 2018-06-03 NOTE — Telephone Encounter (Signed)
New Message    Pts wife is calling because the pt was in the emergency room on March 7th and they were wondering if the loop recorder showed possible Afib on that day  Pts wife is wondering if the results from that day could be mailed or emailed or faxed. Thank you   Please call

## 2018-06-03 NOTE — Telephone Encounter (Signed)
Lets do a tele visit Tuesday

## 2018-06-03 NOTE — Telephone Encounter (Signed)
OK to turn pause and brady back on. Not urgent if no further episodes of syncope.

## 2018-06-06 ENCOUNTER — Telehealth: Payer: Self-pay | Admitting: *Deleted

## 2018-06-06 NOTE — Telephone Encounter (Signed)
Left message for patient to return call to arrange a televisit for tomorrow, 06/07/2018, per Dr. Bettina Gavia.

## 2018-06-09 ENCOUNTER — Other Ambulatory Visit: Payer: Self-pay

## 2018-06-09 ENCOUNTER — Ambulatory Visit (INDEPENDENT_AMBULATORY_CARE_PROVIDER_SITE_OTHER): Payer: PPO | Admitting: *Deleted

## 2018-06-09 DIAGNOSIS — I639 Cerebral infarction, unspecified: Secondary | ICD-10-CM | POA: Diagnosis not present

## 2018-06-11 LAB — CUP PACEART REMOTE DEVICE CHECK
Date Time Interrogation Session: 20200326233908
Implantable Pulse Generator Implant Date: 20180726

## 2018-06-13 ENCOUNTER — Telehealth: Payer: Self-pay | Admitting: Cardiology

## 2018-06-13 ENCOUNTER — Telehealth (INDEPENDENT_AMBULATORY_CARE_PROVIDER_SITE_OTHER): Payer: PPO | Admitting: Cardiology

## 2018-06-13 ENCOUNTER — Other Ambulatory Visit: Payer: Self-pay

## 2018-06-13 ENCOUNTER — Encounter: Payer: Self-pay | Admitting: Cardiology

## 2018-06-13 VITALS — BP 110/68 | Wt 239.0 lb

## 2018-06-13 DIAGNOSIS — Z95818 Presence of other cardiac implants and grafts: Secondary | ICD-10-CM

## 2018-06-13 DIAGNOSIS — I739 Peripheral vascular disease, unspecified: Secondary | ICD-10-CM | POA: Diagnosis not present

## 2018-06-13 DIAGNOSIS — Q211 Atrial septal defect: Secondary | ICD-10-CM

## 2018-06-13 DIAGNOSIS — Z7901 Long term (current) use of anticoagulants: Secondary | ICD-10-CM | POA: Diagnosis not present

## 2018-06-13 DIAGNOSIS — R55 Syncope and collapse: Secondary | ICD-10-CM

## 2018-06-13 DIAGNOSIS — I724 Aneurysm of artery of lower extremity: Secondary | ICD-10-CM

## 2018-06-13 DIAGNOSIS — Q2112 Patent foramen ovale: Secondary | ICD-10-CM

## 2018-06-13 DIAGNOSIS — E785 Hyperlipidemia, unspecified: Secondary | ICD-10-CM | POA: Diagnosis not present

## 2018-06-13 HISTORY — DX: Syncope and collapse: R55

## 2018-06-13 NOTE — Progress Notes (Signed)
Virtual Visit via Video Note    Evaluation Performed:  Follow-up visit  This visit type was conducted due to national recommendations for restrictions regarding the COVID-19 Pandemic (e.g. social distancing).  This format is felt to be most appropriate for this patient at this time.  All issues noted in this document were discussed and addressed.  No physical exam was performed (except for noted visual exam findings with Video Visits).  Please refer to the patient's chart (MyChart message for video visits and phone note for telephone visits) for the patient's consent to telehealth for [facility].  Date:  06/13/2018  Using doxy me  ID:  Troy Bentley, DOB 1945-04-09, MRN [id number]  Patient Location:  Home  Provider location:   [facility]  PCP:  Raina Mina., MD  Cardiologist:  No primary care provider on file. Dr Patrica Duel Electrophysiologist:  None   Chief Complaint:  FU after episode of syncope  History of Present Illness:    Troy Bentley is a 73 y.o. male who presents via audio/video conferencing for a telehealth visit today.    The patient does not symptoms concerning for COVID-19 infection (fever, chills, cough, or new shortness of breath).   Troy Bentley is a 73 y.o. male with a hx of small PFO and multiple CVA with an ILR to evaluate re cardiac source  last seen 10/20/16.  His loop recorder downloads that showed no evidence of atrial fibrillation.  He had TEE performed last summer which showed a very small PFO not felt to require closure    Prior CV studies:   The following studies were reviewed today:    Past Medical History:  Diagnosis Date   BPH (benign prostatic hyperplasia)    Compression fracture of lumbar vertebra (HCC)    Hernia, umbilical    Hyperlipemia    Kidney stone    Pneumonia    Stroke [facility])    Past Surgical History:  Procedure Laterality Date   BACK SURGERY  2005   EYE SURGERY     bilateral cataract surgery    LOOP RECORDER INSERTION N/A 10/08/2016   Procedure: Loop Recorder Insertion;  Surgeon: Constance Haw, MD;  Location: [place] CV LAB;  Service: Cardiovascular;  Laterality: N/A;   ORIF CLAVICULAR FRACTURE  04/15/2012   Procedure: OPEN REDUCTION INTERNAL FIXATION (ORIF) CLAVICULAR FRACTURE;  Surgeon: Augustin Schooling, MD;  Location: [place];  Service: Orthopedics;  Laterality: Left;   TEE WITHOUT CARDIOVERSION N/A 09/17/2016   Procedure: TRANSESOPHAGEAL ECHOCARDIOGRAM (TEE);  Surgeon: Larey Dresser, MD;  Location: [facility] ENDOSCOPY;  Service: Cardiovascular;  Laterality: N/A;     Current Meds  Medication Sig   alfuzosin (UROXATRAL) 10 MG 24 hr tablet Take 10 mg by mouth daily after supper.    Ascorbic Acid (VITAMIN C) 1000 MG tablet Take 1,000 mg by mouth daily.    finasteride (PROSCAR) 5 MG tablet TK 1 T PO D   Multiple Vitamin (MULTIVITAMIN WITH MINERALS) TABS Take 1 tablet by mouth daily.   Multiple Vitamins-Minerals (MULTIVITAMIN) LIQD Take 20 mLs by mouth daily. Sea Aloe multivitamin liquid   NON FORMULARY Elderberry Juice-1 ounce daily   polyethylene glycol (MIRALAX / GLYCOLAX) packet Take 17 g by mouth daily.    pravastatin (PRAVACHOL) 80 MG tablet Take 80 mg by mouth at bedtime.    XARELTO 20 MG TABS tablet Take 1 tablet (20 mg) daily with supper.     Allergies:   Fenofibrate   Social  History   Tobacco Use   Smoking status: Former Smoker    Types: Cigarettes   Smokeless tobacco: Never Used   Tobacco comment: quit 1991  Substance Use Topics   Alcohol use: No   Drug use: No     Family Hx: The patient's family history includes Cancer in his father; Hyperlipidemia in his mother.  ROS:   Please see the history of present illness.     All other systems reviewed and are negative.   Labs/Other Tests and Data Reviewed:    Recent Labs: No results found for requested labs within last 8760 hours.   Recent Lipid Panel No results found for: CHOL, TRIG, HDL,  CHOLHDL, LDLCALC, LDLDIRECT  Wt Readings from Last 3 Encounters:  06/13/18 239 lb (108.4 kg)  01/12/18 234 lb (106.1 kg)  10/20/16 225 lb 12.8 oz (102.4 kg)     Exam:    Vital Signs:  BP 110/68    Wt 239 lb (108.4 kg)    BMI 33.33 kg/m    Well nourished, well developed male in no acute distress. Visually he is wearing an eye patch on the right side.  His skin is normal appearance and his wife tells me it is warm to the touch nondiaphoretic.  He had no neck vein distention or peripheral edema.  ASSESSMENT & PLAN:    1.  Syncope, fortunately has implanted loop recorder not due to arrhythmia takes an alpha-blocker for BPH I have asked him to discontinue and if he has symptoms that are bothersome to contact his urologist.  He will also begin to use knee-high medium support hose. 2.  PFO stable continue anticoagulation 3.  Chronic anticoagulation continue his same 4.  Hyperlipidemia continue a statin with stroke 5.  Implanted loop recorder I reviewed his last download with the patient which occurred after his admission [facility] in [facility] and he has no bradycardia or atrial fibrillation.  The episodes are not related to arrhythmia. 6.  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  the importance of social distancing was discussed today.  Patient Risk:   After full review of this patients clinical status, I feel that they are at least moderate risk at this time.  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    He had the onset of diplopia 04/18/2018 was seen and admitted [facility] by neurology.  They felt that this was due to old stroke and on MRI had no acute lesion.  The symptoms have persisted has been seen by an eye doctor is wearing a patch told he is a 32-month causing and arrangements are being made for further outpatient follow-up.  Subsequently seen at [facility] 05/21/2018 after an episode of syncope.  He is admitted to the hospital no discrete  etiology no arrhythmia documented and download of his implanted loop recorder showed no bradycardia or atrial fibrillation.  He has BPH takes an alpha-blocker and clinically is having orthostatic hypotension.  I asked him to stop monitor his urologic symptoms if they worsen to contact his urologist.  He has had extensive testing performed when he was at [facility] he had mild pancytopenia white count 3300 hemoglobin 13.8 platelets 138,000 CMP was normal cholesterol 190 with an LDL of 35 and even a cardiac troponin was checked normal.  The EKG at [facility] showed sinus bradycardia normal MRI showed old stroke no acute lesion. Medication Adjustments/Labs and Tests Ordered: Current medicines are reviewed at length with the patient today.  Concerns regarding medicines are outlined above.  Tests Ordered: No orders of the defined types were placed in this encounter.  Medication Changes: No orders of the defined types were placed in this encounter.   Disposition:  Follow up In the office 3 months  Signed, Shirlee More, MD  06/13/2018 4:28 PM    [place]

## 2018-06-13 NOTE — Telephone Encounter (Signed)
° °  Cardiac Questionnaire:    Since your last visit or hospitalization:    1. Have you been having new or worsening chest pain? NO   2. Have you been having new or worsening shortness of breath? no 3. Have you been having new or worsening leg swelling, wt gain, or increase in abdominal girth (pants fitting more tightly)? no   4. Have you had any passing out spells? no    *A YES to any of these questions would result in the appointment being kept. *If all the answers to these questions are NO, we should indicate that given the current situation regarding the worldwide coronarvirus pandemic, at the recommendation of the CDC, we are looking to limit gatherings in our waiting area, and thus will reschedule their appointment beyond four weeks from today.   _____________   [id number] Pre-Screening Questions:   Do you currently have a fever? no  Have you recently travelled on a cruise, internationally, or to [place], [place], [place], or [place], [place]) ?no  Have you been in contact with someone that is currently pending confirmation of Covid19 testing or has been confirmed to have the [place] virus?  no  Are you currently experiencing fatigue or cough? no

## 2018-06-13 NOTE — Patient Instructions (Signed)

## 2018-06-14 DIAGNOSIS — L821 Other seborrheic keratosis: Secondary | ICD-10-CM | POA: Diagnosis not present

## 2018-06-14 DIAGNOSIS — D1801 Hemangioma of skin and subcutaneous tissue: Secondary | ICD-10-CM | POA: Diagnosis not present

## 2018-06-14 DIAGNOSIS — D2239 Melanocytic nevi of other parts of face: Secondary | ICD-10-CM | POA: Diagnosis not present

## 2018-06-14 DIAGNOSIS — D225 Melanocytic nevi of trunk: Secondary | ICD-10-CM | POA: Diagnosis not present

## 2018-06-14 NOTE — Progress Notes (Signed)
Carelink Summary Report / Loop Recorder 

## 2018-06-15 DIAGNOSIS — G4733 Obstructive sleep apnea (adult) (pediatric): Secondary | ICD-10-CM | POA: Diagnosis not present

## 2018-06-15 DIAGNOSIS — I69898 Other sequelae of other cerebrovascular disease: Secondary | ICD-10-CM | POA: Diagnosis not present

## 2018-06-15 DIAGNOSIS — D6861 Antiphospholipid syndrome: Secondary | ICD-10-CM | POA: Diagnosis not present

## 2018-06-15 DIAGNOSIS — E7800 Pure hypercholesterolemia, unspecified: Secondary | ICD-10-CM | POA: Diagnosis not present

## 2018-06-15 DIAGNOSIS — H4922 Sixth [abducent] nerve palsy, left eye: Secondary | ICD-10-CM | POA: Diagnosis not present

## 2018-06-15 DIAGNOSIS — Z9989 Dependence on other enabling machines and devices: Secondary | ICD-10-CM | POA: Diagnosis not present

## 2018-07-07 DIAGNOSIS — G4733 Obstructive sleep apnea (adult) (pediatric): Secondary | ICD-10-CM | POA: Diagnosis not present

## 2018-07-09 DIAGNOSIS — G4733 Obstructive sleep apnea (adult) (pediatric): Secondary | ICD-10-CM | POA: Diagnosis not present

## 2018-07-09 DIAGNOSIS — R269 Unspecified abnormalities of gait and mobility: Secondary | ICD-10-CM | POA: Diagnosis not present

## 2018-07-12 ENCOUNTER — Other Ambulatory Visit: Payer: Self-pay

## 2018-07-12 ENCOUNTER — Ambulatory Visit (INDEPENDENT_AMBULATORY_CARE_PROVIDER_SITE_OTHER): Payer: PPO | Admitting: *Deleted

## 2018-07-12 DIAGNOSIS — R55 Syncope and collapse: Secondary | ICD-10-CM | POA: Diagnosis not present

## 2018-07-12 DIAGNOSIS — I639 Cerebral infarction, unspecified: Secondary | ICD-10-CM

## 2018-07-13 DIAGNOSIS — G4733 Obstructive sleep apnea (adult) (pediatric): Secondary | ICD-10-CM | POA: Diagnosis not present

## 2018-07-13 DIAGNOSIS — Z8673 Personal history of transient ischemic attack (TIA), and cerebral infarction without residual deficits: Secondary | ICD-10-CM | POA: Diagnosis not present

## 2018-07-13 DIAGNOSIS — J3489 Other specified disorders of nose and nasal sinuses: Secondary | ICD-10-CM | POA: Diagnosis not present

## 2018-07-13 DIAGNOSIS — Z86711 Personal history of pulmonary embolism: Secondary | ICD-10-CM | POA: Diagnosis not present

## 2018-07-13 DIAGNOSIS — Z9989 Dependence on other enabling machines and devices: Secondary | ICD-10-CM | POA: Diagnosis not present

## 2018-07-13 LAB — CUP PACEART REMOTE DEVICE CHECK
Date Time Interrogation Session: 20200429000707
Implantable Pulse Generator Implant Date: 20180726

## 2018-07-20 NOTE — Progress Notes (Signed)
Carelink Summary Report / Loop Recorder 

## 2018-08-03 DIAGNOSIS — H4922 Sixth [abducent] nerve palsy, left eye: Secondary | ICD-10-CM | POA: Diagnosis not present

## 2018-08-08 DIAGNOSIS — G4733 Obstructive sleep apnea (adult) (pediatric): Secondary | ICD-10-CM | POA: Diagnosis not present

## 2018-08-08 DIAGNOSIS — R269 Unspecified abnormalities of gait and mobility: Secondary | ICD-10-CM | POA: Diagnosis not present

## 2018-08-15 ENCOUNTER — Ambulatory Visit (INDEPENDENT_AMBULATORY_CARE_PROVIDER_SITE_OTHER): Payer: PPO | Admitting: *Deleted

## 2018-08-15 DIAGNOSIS — R55 Syncope and collapse: Secondary | ICD-10-CM | POA: Diagnosis not present

## 2018-08-15 LAB — CUP PACEART REMOTE DEVICE CHECK
Date Time Interrogation Session: 20200601003721
Implantable Pulse Generator Implant Date: 20180726

## 2018-08-22 NOTE — Progress Notes (Signed)
Carelink Summary Report / Loop Recorder 

## 2018-09-08 DIAGNOSIS — R269 Unspecified abnormalities of gait and mobility: Secondary | ICD-10-CM | POA: Diagnosis not present

## 2018-09-08 DIAGNOSIS — G4733 Obstructive sleep apnea (adult) (pediatric): Secondary | ICD-10-CM | POA: Diagnosis not present

## 2018-09-13 DIAGNOSIS — I724 Aneurysm of artery of lower extremity: Secondary | ICD-10-CM | POA: Diagnosis not present

## 2018-09-13 DIAGNOSIS — Z8673 Personal history of transient ischemic attack (TIA), and cerebral infarction without residual deficits: Secondary | ICD-10-CM | POA: Diagnosis not present

## 2018-09-13 DIAGNOSIS — I6523 Occlusion and stenosis of bilateral carotid arteries: Secondary | ICD-10-CM

## 2018-09-13 DIAGNOSIS — E7800 Pure hypercholesterolemia, unspecified: Secondary | ICD-10-CM | POA: Diagnosis not present

## 2018-09-13 DIAGNOSIS — Z86711 Personal history of pulmonary embolism: Secondary | ICD-10-CM | POA: Diagnosis not present

## 2018-09-13 HISTORY — DX: Occlusion and stenosis of bilateral carotid arteries: I65.23

## 2018-09-19 ENCOUNTER — Ambulatory Visit (INDEPENDENT_AMBULATORY_CARE_PROVIDER_SITE_OTHER): Payer: PPO | Admitting: *Deleted

## 2018-09-19 DIAGNOSIS — R55 Syncope and collapse: Secondary | ICD-10-CM | POA: Diagnosis not present

## 2018-09-19 LAB — CUP PACEART REMOTE DEVICE CHECK
Date Time Interrogation Session: 20200704003818
Implantable Pulse Generator Implant Date: 20180726

## 2018-09-20 ENCOUNTER — Ambulatory Visit: Payer: PPO | Admitting: Cardiology

## 2018-09-26 NOTE — Progress Notes (Signed)
Carelink Summary Report / Loop Recorder 

## 2018-09-29 ENCOUNTER — Ambulatory Visit: Payer: PPO | Admitting: Cardiology

## 2018-09-29 DIAGNOSIS — N401 Enlarged prostate with lower urinary tract symptoms: Secondary | ICD-10-CM | POA: Diagnosis not present

## 2018-09-29 DIAGNOSIS — R972 Elevated prostate specific antigen [PSA]: Secondary | ICD-10-CM | POA: Diagnosis not present

## 2018-09-29 DIAGNOSIS — H4922 Sixth [abducent] nerve palsy, left eye: Secondary | ICD-10-CM | POA: Diagnosis not present

## 2018-09-29 DIAGNOSIS — H1132 Conjunctival hemorrhage, left eye: Secondary | ICD-10-CM | POA: Diagnosis not present

## 2018-10-04 NOTE — Progress Notes (Signed)
Cardiology Office Note:    Date:  10/05/2018   ID:  Troy Bentley, DOB 1945/09/04, MRN [id number]  PCP:  Raina Mina., MD  Cardiologist:  Shirlee More, MD    Referring MD: Raina Mina., MD    ASSESSMENT:    1. Cerebrovascular accident (CVA), unspecified mechanism ([place])   2. PFO (patent foramen ovale)   3. Status post placement of implantable loop recorder   4. Chronic anticoagulation   5. Mixed hyperlipidemia   6. Aneurysm of left femoral artery (HCC)    PLAN:    In order of problems listed above:  1. Stable and strongly encouraged to remain on long-term Xarelto for stroke 2. Stable previous evaluation did not favor closing remain anticoagulated with stroke 3. Continue to follow through our practice he has had no documented atrial fibrillation 4. Continue his anticoagulant for stroke and peripheral arterial disease no bleeding complication 5. Continue with statin his LDL is at target 6. Stable followed at [facility] vascular   Next appointment: 6 mos   Medication Adjustments/Labs and Tests Ordered: Current medicines are reviewed at length with the patient today.  Concerns regarding medicines are outlined above.  No orders of the defined types were placed in this encounter.  No orders of the defined types were placed in this encounter.   Chief Complaint  Patient presents with  . Follow-up  . Anticoagulation    History of Present Illness:    Troy Bentley is a 73 y.o. male with a hx of small PFO and multiple CVA involving the right thalmus and bilateral cerebellar hemisphere lacunar infarcts. with an ILR to evaluate re cardiac source  last seen 06/13/2018.   His loop recorder downloads have shown no evidence of atrial fibrillation.  He had a TEE performed last summer which showed a very small PFO not felt to require closure and is anticoagulated after pulmonary embolism.  Remote IRL device check 09/17/2018:    Carelink summary report received.  Battery status OK. Normal device function. No new symptom episodes, tachy episodes, brady, or pause episodes. No new AF episodes. Monthly summary reports and ROV/PRN .  Compliance with diet, lifestyle and medications: Yes  He continues to follow with vascular surgery [facility] and have carotids and lower extremity done I cannot see the report.  He has had no recurrent neurologic events recently was advised to take saw palmetto over-the-counter as an alpha-blocker cause syncope in the past.  He tolerates his statin without muscular symptoms or remains long-term anticoagulated for stroke prophylaxis and a history of previous venous thromboembolism. Past Medical History:  Diagnosis Date  . BPH (benign prostatic hyperplasia)   . Compression fracture of lumbar vertebra ([place])   . Hernia, umbilical   . Hyperlipemia   . Kidney stone   . Pneumonia   . Stroke [facility])     Past Surgical History:  Procedure Laterality Date  . BACK SURGERY  2005  . EYE SURGERY     bilateral cataract surgery  . LOOP RECORDER INSERTION N/A 10/08/2016   Procedure: Loop Recorder Insertion;  Surgeon: Constance Haw, MD;  Location: [place] CV LAB;  Service: Cardiovascular;  Laterality: N/A;  . ORIF CLAVICULAR FRACTURE  04/15/2012   Procedure: OPEN REDUCTION INTERNAL FIXATION (ORIF) CLAVICULAR FRACTURE;  Surgeon: Augustin Schooling, MD;  Location: [place];  Service: Orthopedics;  Laterality: Left;  . TEE WITHOUT CARDIOVERSION N/A 09/17/2016   Procedure: TRANSESOPHAGEAL ECHOCARDIOGRAM (TEE);  Surgeon: Larey Dresser,  MD;  Location: [place] ENDOSCOPY;  Service: Cardiovascular;  Laterality: N/A;    Current Medications: Current Meds  Medication Sig  . Ascorbic Acid (VITAMIN C) 1000 MG tablet Take 1,000 mg by mouth daily.   . finasteride (PROSCAR) 5 MG tablet TK 1 T PO D  . fluticasone (FLONASE) 50 MCG/ACT nasal spray Place 1 spray into both nostrils daily.  . Multiple Vitamin (MULTIVITAMIN WITH MINERALS) TABS Take 1  tablet by mouth daily.  . Multiple Vitamins-Minerals (MULTIVITAMIN) LIQD Take 20 mLs by mouth daily. Sea Aloe multivitamin liquid  . polyethylene glycol (MIRALAX / GLYCOLAX) packet Take 17 g by mouth daily.   . pravastatin (PRAVACHOL) 80 MG tablet Take 80 mg by mouth at bedtime.   Alveda Reasons 20 MG TABS tablet Take 1 tablet (20 mg) daily with supper.     Allergies:   Fenofibrate   Social History   Socioeconomic History  . Marital status: Married    Spouse name: Not on file  . Number of children: Not on file  . Years of education: Not on file  . Highest education level: Not on file  Occupational History  . Not on file  Social Needs  . Financial resource strain: Not on file  . Food insecurity    Worry: Not on file    Inability: Not on file  . Transportation needs    Medical: Not on file    Non-medical: Not on file  Tobacco Use  . Smoking status: Former Smoker    Types: Cigarettes  . Smokeless tobacco: Never Used  . Tobacco comment: quit 1991  Substance and Sexual Activity  . Alcohol use: No  . Drug use: No  . Sexual activity: Not on file  Lifestyle  . Physical activity    Days per week: Not on file    Minutes per session: Not on file  . Stress: Not on file  Relationships  . Social Herbalist on phone: Not on file    Gets together: Not on file    Attends religious service: Not on file    Active member of club or organization: Not on file    Attends meetings of clubs or organizations: Not on file    Relationship status: Not on file  Other Topics Concern  . Not on file  Social History Narrative  . Not on file     Family History: The patient's family history includes Cancer in his father; Hyperlipidemia in his mother. ROS:   Please see the history of present illness.    All other systems reviewed and are negative.  EKGs/Labs/Other Studies Reviewed:    The following studies were reviewed today:  EKG 04/18/2018: EKG - Performed by Nursing2/05/2018 [facility] Result Narrative  Ventricular Rate          54    BPM          Atrial Rate            54    BPM          P-R Interval            172    ms          QRS Duration            96    ms          Q-T Interval            396  ms          QTC                375    ms          P Axis               36    degrees        R Axis               -14    degrees        T Axis               56    degrees         Sinus bradycardia  Otherwise normal ECG    Recent Labs:CMP normal 04/18/2018 Hgb 13.8 Plts 138000 No results found for requested labs within last 8760 hours.  Recent Lipid Panel 04/18/2018 Chol 190 TG 307 HDL 50 LDL 35  Physical Exam:    VS:  BP 104/76 (BP Location: Right Arm, Patient Position: Sitting, Bentley Size: Large)   Pulse 63   Ht 5\' 11"  (1.803 m)   Wt 241 lb (109.3 kg)   SpO2 94%   BMI 33.61 kg/m     Wt Readings from Last 3 Encounters:  10/05/18 241 lb (109.3 kg)  06/13/18 239 lb (108.4 kg)  01/12/18 234 lb (106.1 kg)     GEN:  Well nourished, well developed in no acute distress HEENT: Normal NECK: No JVD; No carotid bruits LYMPHATICS: No lymphadenopathy CARDIAC: RRR, no murmurs, rubs, gallops RESPIRATORY:  Clear to auscultation without rales, wheezing or rhonchi  ABDOMEN: Soft, non-tender, non-distended MUSCULOSKELETAL:  No edema; No deformity  SKIN: Warm and dry NEUROLOGIC:  Alert and oriented x 3 PSYCHIATRIC:  Normal affect    Signed, Shirlee More, MD  10/05/2018 8:35 AM    [facility]

## 2018-10-05 ENCOUNTER — Encounter: Payer: Self-pay | Admitting: Cardiology

## 2018-10-05 ENCOUNTER — Ambulatory Visit (INDEPENDENT_AMBULATORY_CARE_PROVIDER_SITE_OTHER): Payer: PPO | Admitting: Cardiology

## 2018-10-05 ENCOUNTER — Other Ambulatory Visit: Payer: Self-pay

## 2018-10-05 VITALS — BP 104/76 | HR 63 | Ht 71.0 in | Wt 241.0 lb

## 2018-10-05 DIAGNOSIS — Q211 Atrial septal defect: Secondary | ICD-10-CM | POA: Diagnosis not present

## 2018-10-05 DIAGNOSIS — Q2112 Patent foramen ovale: Secondary | ICD-10-CM

## 2018-10-05 DIAGNOSIS — Z7901 Long term (current) use of anticoagulants: Secondary | ICD-10-CM | POA: Diagnosis not present

## 2018-10-05 DIAGNOSIS — I639 Cerebral infarction, unspecified: Secondary | ICD-10-CM

## 2018-10-05 DIAGNOSIS — I724 Aneurysm of artery of lower extremity: Secondary | ICD-10-CM

## 2018-10-05 DIAGNOSIS — Z95818 Presence of other cardiac implants and grafts: Secondary | ICD-10-CM

## 2018-10-05 DIAGNOSIS — E782 Mixed hyperlipidemia: Secondary | ICD-10-CM | POA: Diagnosis not present

## 2018-10-05 NOTE — Patient Instructions (Addendum)
Medication Instructions:  Your physician recommends that you continue on your current medications as directed. Please refer to the Current Medication list given to you today.  If you need a refill on your cardiac medications before your next appointment, please call your pharmacy.   Lab work: None If you have labs (blood work) drawn today and your tests are completely normal, you will receive your results only by: [name] MyChart Message (if you have MyChart) OR . A paper copy in the mail If you have any lab test that is abnormal or we need to change your treatment, we will call you to review the results.  Testing/Procedures: None  Follow-Up: At [facility], you and your health needs are our priority.  As part of our continuing mission to provide you with exceptional heart care, we have created designated Provider Care Teams.  These Care Teams include your primary Cardiologist (physician) and Advanced Practice Providers (APPs -  Physician Assistants and Nurse Practitioners) who all work together to provide you with the care you need, when you need it. You will need a follow up appointment in 6 months.   Any Other Special Instructions Will Be Listed Below (If Applicable).

## 2018-10-19 ENCOUNTER — Ambulatory Visit (INDEPENDENT_AMBULATORY_CARE_PROVIDER_SITE_OTHER): Payer: PPO | Admitting: *Deleted

## 2018-10-19 DIAGNOSIS — I639 Cerebral infarction, unspecified: Secondary | ICD-10-CM

## 2018-10-20 LAB — CUP PACEART REMOTE DEVICE CHECK
Date Time Interrogation Session: 20200806013904
Implantable Pulse Generator Implant Date: 20180726

## 2018-10-25 NOTE — Progress Notes (Signed)
Carelink Summary Report / Loop Recorder 

## 2018-10-28 DIAGNOSIS — G4733 Obstructive sleep apnea (adult) (pediatric): Secondary | ICD-10-CM | POA: Diagnosis not present

## 2018-11-03 DIAGNOSIS — H4922 Sixth [abducent] nerve palsy, left eye: Secondary | ICD-10-CM | POA: Diagnosis not present

## 2018-11-22 ENCOUNTER — Ambulatory Visit (INDEPENDENT_AMBULATORY_CARE_PROVIDER_SITE_OTHER): Payer: PPO | Admitting: *Deleted

## 2018-11-22 DIAGNOSIS — I639 Cerebral infarction, unspecified: Secondary | ICD-10-CM

## 2018-11-22 LAB — CUP PACEART REMOTE DEVICE CHECK
Date Time Interrogation Session: 20200908041015
Implantable Pulse Generator Implant Date: 20180726

## 2018-12-02 DIAGNOSIS — G4733 Obstructive sleep apnea (adult) (pediatric): Secondary | ICD-10-CM | POA: Diagnosis not present

## 2018-12-02 DIAGNOSIS — R269 Unspecified abnormalities of gait and mobility: Secondary | ICD-10-CM | POA: Diagnosis not present

## 2018-12-07 NOTE — Progress Notes (Signed)
Carelink Summary Report / Loop Recorder 

## 2018-12-15 DIAGNOSIS — L600 Ingrowing nail: Secondary | ICD-10-CM | POA: Diagnosis not present

## 2018-12-22 DIAGNOSIS — J3489 Other specified disorders of nose and nasal sinuses: Secondary | ICD-10-CM | POA: Diagnosis not present

## 2018-12-22 DIAGNOSIS — Z8673 Personal history of transient ischemic attack (TIA), and cerebral infarction without residual deficits: Secondary | ICD-10-CM | POA: Diagnosis not present

## 2018-12-22 DIAGNOSIS — G4733 Obstructive sleep apnea (adult) (pediatric): Secondary | ICD-10-CM | POA: Diagnosis not present

## 2018-12-26 ENCOUNTER — Ambulatory Visit (INDEPENDENT_AMBULATORY_CARE_PROVIDER_SITE_OTHER): Payer: PPO | Admitting: *Deleted

## 2018-12-26 DIAGNOSIS — I639 Cerebral infarction, unspecified: Secondary | ICD-10-CM

## 2018-12-26 DIAGNOSIS — R55 Syncope and collapse: Secondary | ICD-10-CM

## 2018-12-26 LAB — CUP PACEART REMOTE DEVICE CHECK
Date Time Interrogation Session: 20201012160757
Implantable Pulse Generator Implant Date: 20180726

## 2019-01-01 DIAGNOSIS — G4733 Obstructive sleep apnea (adult) (pediatric): Secondary | ICD-10-CM | POA: Diagnosis not present

## 2019-01-01 DIAGNOSIS — R269 Unspecified abnormalities of gait and mobility: Secondary | ICD-10-CM | POA: Diagnosis not present

## 2019-01-03 DIAGNOSIS — H4922 Sixth [abducent] nerve palsy, left eye: Secondary | ICD-10-CM | POA: Diagnosis not present

## 2019-01-04 NOTE — Progress Notes (Signed)
Carelink Summary Report / Loop Recorder 

## 2019-01-18 DIAGNOSIS — I724 Aneurysm of artery of lower extremity: Secondary | ICD-10-CM | POA: Diagnosis not present

## 2019-01-18 DIAGNOSIS — R7301 Impaired fasting glucose: Secondary | ICD-10-CM | POA: Diagnosis not present

## 2019-01-18 DIAGNOSIS — Z5181 Encounter for therapeutic drug level monitoring: Secondary | ICD-10-CM | POA: Diagnosis not present

## 2019-01-18 DIAGNOSIS — G4733 Obstructive sleep apnea (adult) (pediatric): Secondary | ICD-10-CM | POA: Diagnosis not present

## 2019-01-18 DIAGNOSIS — E782 Mixed hyperlipidemia: Secondary | ICD-10-CM | POA: Diagnosis not present

## 2019-01-18 DIAGNOSIS — H4922 Sixth [abducent] nerve palsy, left eye: Secondary | ICD-10-CM | POA: Diagnosis not present

## 2019-01-18 DIAGNOSIS — N400 Enlarged prostate without lower urinary tract symptoms: Secondary | ICD-10-CM | POA: Diagnosis not present

## 2019-01-18 DIAGNOSIS — Z23 Encounter for immunization: Secondary | ICD-10-CM | POA: Diagnosis not present

## 2019-01-18 DIAGNOSIS — E041 Nontoxic single thyroid nodule: Secondary | ICD-10-CM | POA: Diagnosis not present

## 2019-01-26 DIAGNOSIS — G4733 Obstructive sleep apnea (adult) (pediatric): Secondary | ICD-10-CM | POA: Diagnosis not present

## 2019-01-28 LAB — CUP PACEART REMOTE DEVICE CHECK
Date Time Interrogation Session: 20201114182533
Implantable Pulse Generator Implant Date: 20180726

## 2019-01-30 ENCOUNTER — Ambulatory Visit (INDEPENDENT_AMBULATORY_CARE_PROVIDER_SITE_OTHER): Payer: PPO | Admitting: *Deleted

## 2019-01-30 DIAGNOSIS — R55 Syncope and collapse: Secondary | ICD-10-CM | POA: Diagnosis not present

## 2019-01-30 DIAGNOSIS — I639 Cerebral infarction, unspecified: Secondary | ICD-10-CM

## 2019-01-31 DIAGNOSIS — R972 Elevated prostate specific antigen [PSA]: Secondary | ICD-10-CM | POA: Diagnosis not present

## 2019-01-31 DIAGNOSIS — N401 Enlarged prostate with lower urinary tract symptoms: Secondary | ICD-10-CM | POA: Diagnosis not present

## 2019-02-01 DIAGNOSIS — G4733 Obstructive sleep apnea (adult) (pediatric): Secondary | ICD-10-CM | POA: Diagnosis not present

## 2019-02-01 DIAGNOSIS — R269 Unspecified abnormalities of gait and mobility: Secondary | ICD-10-CM | POA: Diagnosis not present

## 2019-02-02 DIAGNOSIS — L299 Pruritus, unspecified: Secondary | ICD-10-CM | POA: Diagnosis not present

## 2019-02-02 DIAGNOSIS — D1801 Hemangioma of skin and subcutaneous tissue: Secondary | ICD-10-CM | POA: Diagnosis not present

## 2019-02-02 DIAGNOSIS — D485 Neoplasm of uncertain behavior of skin: Secondary | ICD-10-CM | POA: Diagnosis not present

## 2019-02-02 DIAGNOSIS — L219 Seborrheic dermatitis, unspecified: Secondary | ICD-10-CM | POA: Diagnosis not present

## 2019-02-14 DIAGNOSIS — L600 Ingrowing nail: Secondary | ICD-10-CM | POA: Diagnosis not present

## 2019-02-17 DIAGNOSIS — Z86711 Personal history of pulmonary embolism: Secondary | ICD-10-CM | POA: Diagnosis not present

## 2019-02-17 DIAGNOSIS — D6861 Antiphospholipid syndrome: Secondary | ICD-10-CM | POA: Diagnosis not present

## 2019-02-17 DIAGNOSIS — Z888 Allergy status to other drugs, medicaments and biological substances status: Secondary | ICD-10-CM | POA: Diagnosis not present

## 2019-02-17 DIAGNOSIS — Z79899 Other long term (current) drug therapy: Secondary | ICD-10-CM | POA: Diagnosis not present

## 2019-02-17 DIAGNOSIS — G4733 Obstructive sleep apnea (adult) (pediatric): Secondary | ICD-10-CM | POA: Diagnosis not present

## 2019-02-17 DIAGNOSIS — Z87891 Personal history of nicotine dependence: Secondary | ICD-10-CM | POA: Diagnosis not present

## 2019-02-17 DIAGNOSIS — Z8673 Personal history of transient ischemic attack (TIA), and cerebral infarction without residual deficits: Secondary | ICD-10-CM | POA: Diagnosis not present

## 2019-02-17 DIAGNOSIS — Z7901 Long term (current) use of anticoagulants: Secondary | ICD-10-CM | POA: Diagnosis not present

## 2019-02-17 DIAGNOSIS — I639 Cerebral infarction, unspecified: Secondary | ICD-10-CM | POA: Diagnosis not present

## 2019-02-17 DIAGNOSIS — Z9989 Dependence on other enabling machines and devices: Secondary | ICD-10-CM | POA: Diagnosis not present

## 2019-02-17 DIAGNOSIS — H532 Diplopia: Secondary | ICD-10-CM | POA: Diagnosis not present

## 2019-02-17 DIAGNOSIS — E7800 Pure hypercholesterolemia, unspecified: Secondary | ICD-10-CM | POA: Diagnosis not present

## 2019-02-17 DIAGNOSIS — Z09 Encounter for follow-up examination after completed treatment for conditions other than malignant neoplasm: Secondary | ICD-10-CM | POA: Diagnosis not present

## 2019-02-23 NOTE — Progress Notes (Signed)
Carelink Summary Report / Loop Recorder 

## 2019-03-02 ENCOUNTER — Ambulatory Visit (INDEPENDENT_AMBULATORY_CARE_PROVIDER_SITE_OTHER): Payer: PPO | Admitting: *Deleted

## 2019-03-02 DIAGNOSIS — I639 Cerebral infarction, unspecified: Secondary | ICD-10-CM | POA: Diagnosis not present

## 2019-03-02 LAB — CUP PACEART REMOTE DEVICE CHECK
Date Time Interrogation Session: 20201217133104
Implantable Pulse Generator Implant Date: 20180726

## 2019-03-07 ENCOUNTER — Other Ambulatory Visit: Payer: Self-pay | Admitting: *Deleted

## 2019-03-07 ENCOUNTER — Telehealth: Payer: Self-pay | Admitting: Cardiology

## 2019-03-07 MED ORDER — XARELTO 20 MG PO TABS: ORAL_TABLET | ORAL | 1 refills | Status: DC

## 2019-03-07 NOTE — Telephone Encounter (Signed)
Refill sent.

## 2019-03-07 NOTE — Telephone Encounter (Signed)
*  STAT* If patient is at the pharmacy, call can be transferred to refill team.   1. Which medications need to be refilled? (please list name of each medication and dose if known) Xarelto 20mg   2. Which pharmacy/location (including street and city if local pharmacy) is medication to be sent to?[facility] [address] [place]  3. Do they need a 30 day or 90 day supply? [place]

## 2019-03-15 ENCOUNTER — Other Ambulatory Visit: Payer: Self-pay

## 2019-03-15 MED ORDER — XARELTO 20 MG PO TABS: ORAL_TABLET | ORAL | 1 refills | Status: DC

## 2019-04-01 NOTE — Progress Notes (Signed)
ILR remote 

## 2019-04-04 ENCOUNTER — Ambulatory Visit (INDEPENDENT_AMBULATORY_CARE_PROVIDER_SITE_OTHER): Payer: PPO | Admitting: *Deleted

## 2019-04-04 DIAGNOSIS — I639 Cerebral infarction, unspecified: Secondary | ICD-10-CM

## 2019-04-04 LAB — CUP PACEART REMOTE DEVICE CHECK
Date Time Interrogation Session: 20210119134118
Implantable Pulse Generator Implant Date: 20180726

## 2019-04-05 DIAGNOSIS — H4922 Sixth [abducent] nerve palsy, left eye: Secondary | ICD-10-CM | POA: Diagnosis not present

## 2019-04-10 DIAGNOSIS — H5032 Intermittent alternating esotropia: Secondary | ICD-10-CM | POA: Diagnosis not present

## 2019-04-15 NOTE — Progress Notes (Signed)
Cardiology Office Note:    Date:  04/17/2019   ID:  Troy Bentley, DOB March 01, 1946, MRN [id number]  PCP:  Raina Mina., MD  Cardiologist:  Shirlee More, MD    Referring MD: Raina Mina., MD    ASSESSMENT:    1. PFO (patent foramen ovale)   2. Status post placement of implantable loop recorder   3. Chronic anticoagulation   4. Mixed hyperlipidemia   5. Cerebrovascular accident (CVA), unspecified mechanism ([place])   6. Aneurysm of left femoral artery ([place])   7. Aneurysm of left popliteal artery (HCC)    PLAN:    In order of problems listed above:  1. He has multiple mechanisms for his stroke including PFO with venous thromboembolism is hypercoagulable state and remain on his current direct anticoagulant indefinitely. 2. Download show no incidence of atrial fibrillation or bradycardia 3. Stable hyperlipidemia continue a statin 4. For peripheral vascular disease with femoral-popliteal aneurysm left lower extremity   Next appointment: 6 months   Medication Adjustments/Labs and Tests Ordered: Current medicines are reviewed at length with the patient today.  Concerns regarding medicines are outlined above.  No orders of the defined types were placed in this encounter.  No orders of the defined types were placed in this encounter.   Chief Complaint  Patient presents with  . Follow-up    For multiple strokes with PFO and pulmonary embolism  . Anticoagulation    History of Present Illness:    Troy Bentley is a 74 y.o. male with a hx of small PFO and multiple CVA involving the right thalmus and bilateral cerebellar hemisphere lacunar infarcts. with an ILR to evaluate re cardiac source and antiphospholipid IgM antibody syndrome  His loop recorder downloads have shown no evidence of atrial fibrillation.  He had a TEE performed in 2019 which showed a very small PFO not felt to require closure and is anticoagulated after pulmonary embolism.  He was last seen 10/05/2018.   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  His device downloads since last visit mostly recently 04/04/2019 showed no episodes of atrial fibrillation or flutter.  03/19/2018 shows CMP with an elevated bilirubin of 1.9 normal renal function potassium 4.1 GFR 65 cc his other liver function test were normal cholesterol 217 HDL 48 LDL direct 41 TSH normal.  But a CBC with a hemoglobin of 15.2 mild thrombocytopenia chronic platelets 138,000 and chronic leukopenia white count 3200 his absolute neutrophil count was low.  The reports in care everywhere but there is a notation that is left femoral artery aneurysm is stable 2.03 cm and left popliteal aneurysm was also stable without intervention. Compliance with diet, lifestyle and medications: Yes  Always nice to see Troy Bentley he is from my hometown in [place].  In general he is done well he still has a little diplopia due to his 6th nerve palsy but he is not unsteady he has had no falls and no palpitation.  He tolerates his anticoagulant without bleeding and has had no edema chest pain or shortness of breath.  He follows with vascular surgery with stable left femoral popliteal aneurysm.  I reviewed his vascular study report and laboratory testing download with the patient during the visit Past Medical History:  Diagnosis Date  . BPH (benign prostatic hyperplasia)   . Compression fracture of lumbar vertebra ([place])   . Hernia, umbilical   . Hyperlipemia   . Kidney stone   . Pneumonia   . Stroke [facility])  Past Surgical History:  Procedure Laterality Date  . BACK SURGERY  2005  . EYE SURGERY     bilateral cataract surgery  . LOOP RECORDER INSERTION N/A 10/08/2016   Procedure: Loop Recorder Insertion;  Surgeon: Constance Haw, MD;  Location: [place] [facility];  Service: Cardiovascular;  Laterality: N/A;  . ORIF CLAVICULAR FRACTURE  04/15/2012   Procedure: OPEN REDUCTION INTERNAL FIXATION (ORIF) CLAVICULAR FRACTURE;  Surgeon: Augustin Schooling, MD;   Location: [place];  Service: Orthopedics;  Laterality: Left;  . TEE WITHOUT CARDIOVERSION N/A 09/17/2016   Procedure: TRANSESOPHAGEAL ECHOCARDIOGRAM (TEE);  Surgeon: Larey Dresser, MD;  Location: [facility] ENDOSCOPY;  Service: Cardiovascular;  Laterality: N/A;    Current Medications: Current Meds  Medication Sig  . Ascorbic Acid (VITAMIN C) 1000 MG tablet Take 1,000 mg by mouth daily.   . finasteride (PROSCAR) 5 MG tablet TK 1 T PO D  . fluticasone (FLONASE) 50 MCG/ACT nasal spray Place 1 spray into both nostrils daily.  . Multiple Vitamin (MULTIVITAMIN WITH MINERALS) TABS Take 1 tablet by mouth daily.  . Multiple Vitamins-Minerals (MULTIVITAMIN) LIQD Take 20 mLs by mouth daily. Sea Aloe multivitamin liquid  . polyethylene glycol (MIRALAX / GLYCOLAX) packet Take 17 g by mouth daily.   . pravastatin (PRAVACHOL) 80 MG tablet Take 80 mg by mouth at bedtime.   Alveda Reasons 20 MG TABS tablet Take 1 tablet (20 mg) daily with supper.     Allergies:   Fenofibrate   Social History   Socioeconomic History  . Marital status: Married    Spouse name: Not on file  . Number of children: Not on file  . Years of education: Not on file  . Highest education level: Not on file  Occupational History  . Not on file  Tobacco Use  . Smoking status: Former Smoker    Types: Cigarettes  . Smokeless tobacco: Never Used  . Tobacco comment: quit 1991  Substance and Sexual Activity  . Alcohol use: No  . Drug use: No  . Sexual activity: Not on file  Other Topics Concern  . Not on file  Social History Narrative  . Not on file   Social Determinants of Health   Financial Resource Strain:   . Difficulty of Paying Living Expenses: Not on file  Food Insecurity:   . Worried About Charity fundraiser in the Last Year: Not on file  . Ran Out of Food in the Last Year: Not on file  Transportation Needs:   . Lack of Transportation (Medical): Not on file  . Lack of Transportation (Non-Medical): Not on file  Physical  Activity:   . Days of Exercise per Week: Not on file  . Minutes of Exercise per Session: Not on file  Stress:   . Feeling of Stress : Not on file  Social Connections:   . Frequency of Communication with Friends and Family: Not on file  . Frequency of Social Gatherings with Friends and Family: Not on file  . Attends Religious Services: Not on file  . Active Member of Clubs or Organizations: Not on file  . Attends Archivist Meetings: Not on file  . Marital Status: Not on file     Family History: The patient's family history includes Cancer in his father; Hyperlipidemia in his mother. ROS:   Please see the history of present illness.    All other systems reviewed and are negative.  EKGs/Labs/Other Studies Reviewed:    The following studies  were reviewed today:  See history for lab ultrasound and download reports  Physical Exam:    VS:  BP (!) 130/58 (BP Location: Left Arm, Patient Position: Sitting, Cuff Size: Normal)   Pulse (!) 56   Ht 5\' 11"  (1.803 m)   Wt 236 lb 12.8 oz (107.4 kg)   SpO2 94%   BMI 33.03 kg/m     Wt Readings from Last 3 Encounters:  04/17/19 236 lb 12.8 oz (107.4 kg)  10/05/18 241 lb (109.3 kg)  06/13/18 239 lb (108.4 kg)     GEN:  Well nourished, well developed in no acute distress HEENT: Normal NECK: No JVD; No carotid bruits LYMPHATICS: No lymphadenopathy CARDIAC: RRR, no murmurs, rubs, gallops RESPIRATORY:  Clear to auscultation without rales, wheezing or rhonchi  ABDOMEN: Soft, non-tender, non-distended MUSCULOSKELETAL:  No edema; No deformity  SKIN: Warm and dry NEUROLOGIC:  Alert and oriented x 3 PSYCHIATRIC:  Normal affect    Signed, Shirlee More, MD  04/17/2019 11:55 AM    [place]

## 2019-04-17 ENCOUNTER — Ambulatory Visit (INDEPENDENT_AMBULATORY_CARE_PROVIDER_SITE_OTHER): Payer: PPO | Admitting: Cardiology

## 2019-04-17 ENCOUNTER — Other Ambulatory Visit: Payer: Self-pay

## 2019-04-17 ENCOUNTER — Encounter: Payer: Self-pay | Admitting: Cardiology

## 2019-04-17 VITALS — BP 130/58 | HR 56 | Ht 71.0 in | Wt 236.8 lb

## 2019-04-17 DIAGNOSIS — I724 Aneurysm of artery of lower extremity: Secondary | ICD-10-CM

## 2019-04-17 DIAGNOSIS — Q211 Atrial septal defect: Secondary | ICD-10-CM | POA: Diagnosis not present

## 2019-04-17 DIAGNOSIS — E782 Mixed hyperlipidemia: Secondary | ICD-10-CM

## 2019-04-17 DIAGNOSIS — I639 Cerebral infarction, unspecified: Secondary | ICD-10-CM

## 2019-04-17 DIAGNOSIS — Z95818 Presence of other cardiac implants and grafts: Secondary | ICD-10-CM

## 2019-04-17 DIAGNOSIS — Z7901 Long term (current) use of anticoagulants: Secondary | ICD-10-CM

## 2019-04-17 DIAGNOSIS — Q2112 Patent foramen ovale: Secondary | ICD-10-CM

## 2019-04-17 NOTE — Patient Instructions (Signed)
Medication Instructions:  Your physician recommends that you continue on your current medications as directed. Please refer to the Current Medication list given to you today.  *If you need a refill on your cardiac medications before your next appointment, please call your pharmacy*  Lab Work: NONE If you have labs (blood work) drawn today and your tests are completely normal, you will receive your results only by: [name] MyChart Message (if you have MyChart) OR . A paper copy in the mail If you have any lab test that is abnormal or we need to change your treatment, we will call you to review the results.  Testing/Procedures: NONE  Follow-Up: At [facility], you and your health needs are our priority.  As part of our continuing mission to provide you with exceptional heart care, we have created designated Provider Care Teams.  These Care Teams include your primary Cardiologist (physician) and Advanced Practice Providers (APPs -  Physician Assistants and Nurse Practitioners) who all work together to provide you with the care you need, when you need it.  Your next appointment:   6 month(s)  The format for your next appointment:   In Person  Provider:   Shirlee More, MD

## 2019-05-05 DIAGNOSIS — R269 Unspecified abnormalities of gait and mobility: Secondary | ICD-10-CM | POA: Diagnosis not present

## 2019-05-05 DIAGNOSIS — G4733 Obstructive sleep apnea (adult) (pediatric): Secondary | ICD-10-CM | POA: Diagnosis not present

## 2019-05-08 ENCOUNTER — Ambulatory Visit (INDEPENDENT_AMBULATORY_CARE_PROVIDER_SITE_OTHER): Payer: PPO | Admitting: *Deleted

## 2019-05-08 DIAGNOSIS — I639 Cerebral infarction, unspecified: Secondary | ICD-10-CM

## 2019-05-08 LAB — CUP PACEART REMOTE DEVICE CHECK
Date Time Interrogation Session: 20210222002035
Implantable Pulse Generator Implant Date: 20180726

## 2019-05-08 NOTE — Progress Notes (Signed)
ILR Remote 

## 2019-06-02 DIAGNOSIS — G4733 Obstructive sleep apnea (adult) (pediatric): Secondary | ICD-10-CM | POA: Diagnosis not present

## 2019-06-02 DIAGNOSIS — R269 Unspecified abnormalities of gait and mobility: Secondary | ICD-10-CM | POA: Diagnosis not present

## 2019-06-05 DIAGNOSIS — N401 Enlarged prostate with lower urinary tract symptoms: Secondary | ICD-10-CM | POA: Diagnosis not present

## 2019-06-05 DIAGNOSIS — R972 Elevated prostate specific antigen [PSA]: Secondary | ICD-10-CM | POA: Diagnosis not present

## 2019-06-08 ENCOUNTER — Ambulatory Visit (INDEPENDENT_AMBULATORY_CARE_PROVIDER_SITE_OTHER): Payer: PPO | Admitting: *Deleted

## 2019-06-08 DIAGNOSIS — I639 Cerebral infarction, unspecified: Secondary | ICD-10-CM

## 2019-06-08 LAB — CUP PACEART REMOTE DEVICE CHECK
Date Time Interrogation Session: 20210325012144
Implantable Pulse Generator Implant Date: 20180726

## 2019-06-08 NOTE — Progress Notes (Signed)
ILR Remote 

## 2019-07-03 DIAGNOSIS — R269 Unspecified abnormalities of gait and mobility: Secondary | ICD-10-CM | POA: Diagnosis not present

## 2019-07-03 DIAGNOSIS — G4733 Obstructive sleep apnea (adult) (pediatric): Secondary | ICD-10-CM | POA: Diagnosis not present

## 2019-07-10 LAB — CUP PACEART REMOTE DEVICE CHECK
Date Time Interrogation Session: 20210425012709
Implantable Pulse Generator Implant Date: 20180726

## 2019-07-11 ENCOUNTER — Ambulatory Visit (INDEPENDENT_AMBULATORY_CARE_PROVIDER_SITE_OTHER): Payer: PPO | Admitting: *Deleted

## 2019-07-11 DIAGNOSIS — I639 Cerebral infarction, unspecified: Secondary | ICD-10-CM

## 2019-07-11 NOTE — Progress Notes (Signed)
ILR Remote 

## 2019-07-18 DIAGNOSIS — D6861 Antiphospholipid syndrome: Secondary | ICD-10-CM | POA: Diagnosis not present

## 2019-07-18 DIAGNOSIS — Z0000 Encounter for general adult medical examination without abnormal findings: Secondary | ICD-10-CM | POA: Diagnosis not present

## 2019-07-18 DIAGNOSIS — I724 Aneurysm of artery of lower extremity: Secondary | ICD-10-CM | POA: Diagnosis not present

## 2019-07-18 DIAGNOSIS — I6523 Occlusion and stenosis of bilateral carotid arteries: Secondary | ICD-10-CM | POA: Diagnosis not present

## 2019-07-18 DIAGNOSIS — N400 Enlarged prostate without lower urinary tract symptoms: Secondary | ICD-10-CM | POA: Diagnosis not present

## 2019-07-18 DIAGNOSIS — R7303 Prediabetes: Secondary | ICD-10-CM | POA: Diagnosis not present

## 2019-07-18 DIAGNOSIS — G4733 Obstructive sleep apnea (adult) (pediatric): Secondary | ICD-10-CM | POA: Diagnosis not present

## 2019-07-18 DIAGNOSIS — R471 Dysarthria and anarthria: Secondary | ICD-10-CM | POA: Diagnosis not present

## 2019-07-18 DIAGNOSIS — Q211 Atrial septal defect: Secondary | ICD-10-CM | POA: Diagnosis not present

## 2019-07-18 DIAGNOSIS — D696 Thrombocytopenia, unspecified: Secondary | ICD-10-CM | POA: Diagnosis not present

## 2019-07-18 DIAGNOSIS — R7301 Impaired fasting glucose: Secondary | ICD-10-CM | POA: Diagnosis not present

## 2019-07-18 DIAGNOSIS — E041 Nontoxic single thyroid nodule: Secondary | ICD-10-CM | POA: Diagnosis not present

## 2019-07-18 DIAGNOSIS — R4189 Other symptoms and signs involving cognitive functions and awareness: Secondary | ICD-10-CM | POA: Diagnosis not present

## 2019-07-18 DIAGNOSIS — H4922 Sixth [abducent] nerve palsy, left eye: Secondary | ICD-10-CM | POA: Diagnosis not present

## 2019-07-18 DIAGNOSIS — E785 Hyperlipidemia, unspecified: Secondary | ICD-10-CM | POA: Diagnosis not present

## 2019-07-18 DIAGNOSIS — Q2572 Congenital pulmonary arteriovenous malformation: Secondary | ICD-10-CM | POA: Diagnosis not present

## 2019-07-19 DIAGNOSIS — G4733 Obstructive sleep apnea (adult) (pediatric): Secondary | ICD-10-CM | POA: Diagnosis not present

## 2019-08-02 DIAGNOSIS — R269 Unspecified abnormalities of gait and mobility: Secondary | ICD-10-CM | POA: Diagnosis not present

## 2019-08-02 DIAGNOSIS — G4733 Obstructive sleep apnea (adult) (pediatric): Secondary | ICD-10-CM | POA: Diagnosis not present

## 2019-08-09 ENCOUNTER — Ambulatory Visit (INDEPENDENT_AMBULATORY_CARE_PROVIDER_SITE_OTHER): Payer: PPO | Admitting: *Deleted

## 2019-08-09 DIAGNOSIS — R55 Syncope and collapse: Secondary | ICD-10-CM

## 2019-08-09 LAB — CUP PACEART REMOTE DEVICE CHECK
Date Time Interrogation Session: 20210526014708
Implantable Pulse Generator Implant Date: 20180726

## 2019-08-09 NOTE — Progress Notes (Signed)
Carelink Summary Report / Loop Recorder 

## 2019-08-30 ENCOUNTER — Other Ambulatory Visit: Payer: Self-pay | Admitting: Cardiology

## 2019-09-11 ENCOUNTER — Ambulatory Visit (INDEPENDENT_AMBULATORY_CARE_PROVIDER_SITE_OTHER): Payer: PPO | Admitting: *Deleted

## 2019-09-11 DIAGNOSIS — I639 Cerebral infarction, unspecified: Secondary | ICD-10-CM | POA: Diagnosis not present

## 2019-09-11 LAB — CUP PACEART REMOTE DEVICE CHECK
Date Time Interrogation Session: 20210628025351
Implantable Pulse Generator Implant Date: 20180726

## 2019-09-12 NOTE — Progress Notes (Signed)
Carelink Summary Report / Loop Recorder 

## 2019-09-19 DIAGNOSIS — E7800 Pure hypercholesterolemia, unspecified: Secondary | ICD-10-CM | POA: Diagnosis not present

## 2019-09-19 DIAGNOSIS — I724 Aneurysm of artery of lower extremity: Secondary | ICD-10-CM | POA: Diagnosis not present

## 2019-09-19 DIAGNOSIS — I6523 Occlusion and stenosis of bilateral carotid arteries: Secondary | ICD-10-CM | POA: Diagnosis not present

## 2019-09-19 DIAGNOSIS — Z8673 Personal history of transient ischemic attack (TIA), and cerebral infarction without residual deficits: Secondary | ICD-10-CM | POA: Diagnosis not present

## 2019-09-19 DIAGNOSIS — I723 Aneurysm of iliac artery: Secondary | ICD-10-CM | POA: Diagnosis not present

## 2019-10-11 DIAGNOSIS — R41841 Cognitive communication deficit: Secondary | ICD-10-CM | POA: Diagnosis not present

## 2019-10-11 DIAGNOSIS — I69322 Dysarthria following cerebral infarction: Secondary | ICD-10-CM | POA: Diagnosis not present

## 2019-10-16 ENCOUNTER — Ambulatory Visit (INDEPENDENT_AMBULATORY_CARE_PROVIDER_SITE_OTHER): Payer: PPO | Admitting: *Deleted

## 2019-10-16 DIAGNOSIS — I639 Cerebral infarction, unspecified: Secondary | ICD-10-CM

## 2019-10-16 LAB — CUP PACEART REMOTE DEVICE CHECK
Date Time Interrogation Session: 20210731030450
Implantable Pulse Generator Implant Date: 20180726

## 2019-10-18 NOTE — Progress Notes (Signed)
Carelink Summary Report / Loop Recorder 

## 2019-10-24 DIAGNOSIS — R41841 Cognitive communication deficit: Secondary | ICD-10-CM | POA: Diagnosis not present

## 2019-10-24 DIAGNOSIS — Z8673 Personal history of transient ischemic attack (TIA), and cerebral infarction without residual deficits: Secondary | ICD-10-CM | POA: Diagnosis not present

## 2019-10-24 DIAGNOSIS — R471 Dysarthria and anarthria: Secondary | ICD-10-CM | POA: Diagnosis not present

## 2019-11-07 DIAGNOSIS — G4733 Obstructive sleep apnea (adult) (pediatric): Secondary | ICD-10-CM | POA: Diagnosis not present

## 2019-11-19 LAB — CUP PACEART REMOTE DEVICE CHECK
Date Time Interrogation Session: 20210902030632
Implantable Pulse Generator Implant Date: 20180726

## 2019-11-21 ENCOUNTER — Ambulatory Visit (INDEPENDENT_AMBULATORY_CARE_PROVIDER_SITE_OTHER): Payer: PPO | Admitting: *Deleted

## 2019-11-21 DIAGNOSIS — I639 Cerebral infarction, unspecified: Secondary | ICD-10-CM

## 2019-11-22 DIAGNOSIS — L820 Inflamed seborrheic keratosis: Secondary | ICD-10-CM | POA: Diagnosis not present

## 2019-11-22 DIAGNOSIS — L209 Atopic dermatitis, unspecified: Secondary | ICD-10-CM | POA: Diagnosis not present

## 2019-11-22 NOTE — Progress Notes (Signed)
Carelink Summary Report / Loop Recorder 

## 2019-12-05 ENCOUNTER — Other Ambulatory Visit: Payer: Self-pay | Admitting: Cardiology

## 2019-12-06 DIAGNOSIS — N401 Enlarged prostate with lower urinary tract symptoms: Secondary | ICD-10-CM | POA: Diagnosis not present

## 2019-12-06 DIAGNOSIS — R972 Elevated prostate specific antigen [PSA]: Secondary | ICD-10-CM | POA: Diagnosis not present

## 2019-12-20 LAB — CUP PACEART REMOTE DEVICE CHECK
Date Time Interrogation Session: 20211005030903
Implantable Pulse Generator Implant Date: 20180726

## 2019-12-25 ENCOUNTER — Ambulatory Visit (INDEPENDENT_AMBULATORY_CARE_PROVIDER_SITE_OTHER): Payer: PPO

## 2019-12-25 DIAGNOSIS — I639 Cerebral infarction, unspecified: Secondary | ICD-10-CM | POA: Diagnosis not present

## 2019-12-26 NOTE — Progress Notes (Signed)
Carelink Summary Report / Loop Recorder 

## 2020-01-03 DIAGNOSIS — G4733 Obstructive sleep apnea (adult) (pediatric): Secondary | ICD-10-CM | POA: Diagnosis not present

## 2020-01-03 DIAGNOSIS — Z87891 Personal history of nicotine dependence: Secondary | ICD-10-CM | POA: Diagnosis not present

## 2020-01-03 DIAGNOSIS — Z8673 Personal history of transient ischemic attack (TIA), and cerebral infarction without residual deficits: Secondary | ICD-10-CM | POA: Diagnosis not present

## 2020-01-03 DIAGNOSIS — Q211 Atrial septal defect: Secondary | ICD-10-CM | POA: Diagnosis not present

## 2020-01-03 DIAGNOSIS — Z9989 Dependence on other enabling machines and devices: Secondary | ICD-10-CM | POA: Diagnosis not present

## 2020-01-18 DIAGNOSIS — Q211 Atrial septal defect: Secondary | ICD-10-CM | POA: Diagnosis not present

## 2020-01-18 DIAGNOSIS — R7301 Impaired fasting glucose: Secondary | ICD-10-CM | POA: Diagnosis not present

## 2020-01-18 DIAGNOSIS — E7800 Pure hypercholesterolemia, unspecified: Secondary | ICD-10-CM | POA: Diagnosis not present

## 2020-01-18 DIAGNOSIS — N200 Calculus of kidney: Secondary | ICD-10-CM | POA: Diagnosis not present

## 2020-01-18 DIAGNOSIS — I724 Aneurysm of artery of lower extremity: Secondary | ICD-10-CM | POA: Diagnosis not present

## 2020-01-18 DIAGNOSIS — H4922 Sixth [abducent] nerve palsy, left eye: Secondary | ICD-10-CM | POA: Diagnosis not present

## 2020-01-18 DIAGNOSIS — Z8673 Personal history of transient ischemic attack (TIA), and cerebral infarction without residual deficits: Secondary | ICD-10-CM | POA: Diagnosis not present

## 2020-01-18 DIAGNOSIS — Z8546 Personal history of malignant neoplasm of prostate: Secondary | ICD-10-CM | POA: Diagnosis not present

## 2020-01-18 DIAGNOSIS — Z23 Encounter for immunization: Secondary | ICD-10-CM | POA: Diagnosis not present

## 2020-01-18 DIAGNOSIS — Z86711 Personal history of pulmonary embolism: Secondary | ICD-10-CM | POA: Diagnosis not present

## 2020-01-18 DIAGNOSIS — K449 Diaphragmatic hernia without obstruction or gangrene: Secondary | ICD-10-CM | POA: Diagnosis not present

## 2020-01-18 DIAGNOSIS — D696 Thrombocytopenia, unspecified: Secondary | ICD-10-CM | POA: Diagnosis not present

## 2020-01-18 DIAGNOSIS — E041 Nontoxic single thyroid nodule: Secondary | ICD-10-CM | POA: Diagnosis not present

## 2020-01-21 LAB — CUP PACEART REMOTE DEVICE CHECK
Date Time Interrogation Session: 20211107022845
Implantable Pulse Generator Implant Date: 20180726

## 2020-01-29 ENCOUNTER — Ambulatory Visit (INDEPENDENT_AMBULATORY_CARE_PROVIDER_SITE_OTHER): Payer: PPO

## 2020-01-29 DIAGNOSIS — I639 Cerebral infarction, unspecified: Secondary | ICD-10-CM

## 2020-01-30 NOTE — Progress Notes (Signed)
Carelink Summary Report / Loop Recorder 

## 2020-02-09 DIAGNOSIS — G4733 Obstructive sleep apnea (adult) (pediatric): Secondary | ICD-10-CM | POA: Diagnosis not present

## 2020-02-15 DIAGNOSIS — D225 Melanocytic nevi of trunk: Secondary | ICD-10-CM | POA: Diagnosis not present

## 2020-02-15 DIAGNOSIS — L814 Other melanin hyperpigmentation: Secondary | ICD-10-CM | POA: Diagnosis not present

## 2020-02-15 DIAGNOSIS — D1801 Hemangioma of skin and subcutaneous tissue: Secondary | ICD-10-CM | POA: Diagnosis not present

## 2020-02-15 DIAGNOSIS — D2239 Melanocytic nevi of other parts of face: Secondary | ICD-10-CM | POA: Diagnosis not present

## 2020-03-04 ENCOUNTER — Ambulatory Visit (INDEPENDENT_AMBULATORY_CARE_PROVIDER_SITE_OTHER): Payer: PPO

## 2020-03-04 DIAGNOSIS — I639 Cerebral infarction, unspecified: Secondary | ICD-10-CM | POA: Diagnosis not present

## 2020-03-04 LAB — CUP PACEART REMOTE DEVICE CHECK
Date Time Interrogation Session: 20211218232919
Implantable Pulse Generator Implant Date: 20180726

## 2020-03-18 NOTE — Progress Notes (Signed)
Carelink Summary Report / Loop Recorder 

## 2020-04-05 LAB — CUP PACEART REMOTE DEVICE CHECK
Date Time Interrogation Session: 20220121011355
Implantable Pulse Generator Implant Date: 20180726

## 2020-04-08 ENCOUNTER — Ambulatory Visit (INDEPENDENT_AMBULATORY_CARE_PROVIDER_SITE_OTHER): Payer: PPO

## 2020-04-08 DIAGNOSIS — I639 Cerebral infarction, unspecified: Secondary | ICD-10-CM

## 2020-04-15 ENCOUNTER — Telehealth: Payer: Self-pay | Admitting: Student

## 2020-04-15 NOTE — Telephone Encounter (Signed)
Pt alert with Tachy episode that appears EMI.   Multiple unsent episodes. Called to have patient send a manual transmission.  Monitor with error code 3248. [employer] number given.  Wife has to leave for work now so they will call either later this afternoon or early tomorrow morning.   They will update [place] with either a sent transmission or if they will have to wait for a new monitor.   He denies any symptoms.   Troy Bentley [address] [place], PA-C  04/15/2020 10:01 AM

## 2020-04-18 NOTE — Progress Notes (Signed)
Carelink Summary Report / Loop Recorder 

## 2020-04-19 NOTE — Telephone Encounter (Signed)
I spoke with the patient wife and she states she was told to go online to trouble shoot the monitor for the patient. She was not at home to help trouble shoot the monitor. I told her I will call tech support and order the patient a monitor.  I spoke with tech support the patient will receive his new monitor in 7-10 business days.

## 2020-04-26 NOTE — Telephone Encounter (Signed)
The pt wife states she will send the transmission this weekend.

## 2020-04-30 NOTE — Telephone Encounter (Signed)
Transmission reviewed. Events appear EMI.

## 2020-04-30 NOTE — Telephone Encounter (Signed)
Full transmission received 04/26/2020

## 2020-05-09 DIAGNOSIS — L309 Dermatitis, unspecified: Secondary | ICD-10-CM | POA: Diagnosis not present

## 2020-05-09 LAB — CUP PACEART REMOTE DEVICE CHECK
Date Time Interrogation Session: 20220223023351
Implantable Pulse Generator Implant Date: 20180726

## 2020-05-13 ENCOUNTER — Ambulatory Visit (INDEPENDENT_AMBULATORY_CARE_PROVIDER_SITE_OTHER): Payer: PPO

## 2020-05-13 DIAGNOSIS — I639 Cerebral infarction, unspecified: Secondary | ICD-10-CM

## 2020-05-20 NOTE — Progress Notes (Signed)
Carelink Summary Report / Loop Recorder 

## 2020-06-10 LAB — CUP PACEART REMOTE DEVICE CHECK
Date Time Interrogation Session: 20220328033613
Implantable Pulse Generator Implant Date: 20180726

## 2020-06-11 ENCOUNTER — Telehealth: Payer: Self-pay

## 2020-06-11 NOTE — Telephone Encounter (Signed)
ILR at RRT, spoke with pt spouse, Fraser Din (DPR on file).    Advised if pt wishes to have removed, he will need appt with Dr. [name], most likely at [place].  She reports pt will discuss with Dr. Bettina Gavia and make decision on whether to have removed or not.

## 2020-06-17 ENCOUNTER — Telehealth: Payer: Self-pay | Admitting: Cardiology

## 2020-06-17 NOTE — Telephone Encounter (Signed)
Pt c/o Shortness Of Breath: STAT if SOB developed within the last 24 hours or pt is noticeably SOB on the phone  1. Are you currently SOB (can you hear that pt is SOB on the phone)? no  2. How long have you been experiencing SOB? Couple of months  3. Are you SOB when sitting or when up moving around? Up moving around  4. Are you currently experiencing any other symptoms? Wheezing with very little exertion and per wife, patient starts frowning out of nowhere as well. She states that he says his face does it on his own.

## 2020-06-17 NOTE — Telephone Encounter (Signed)
Spoke to the patient and the patients wife who let me know that the patient has been "frowning" off and on for the last several months. He states that he can feel it happening but he does not mean to do it. He can not stop the frowning from happening. He has been short of breath and is wheezing with very little exertion. No other symptoms noted.   She states the last time this "frowning" happened was about two weeks ago. She is mostly concerned with the wheezing that he is having.   She is going to contact his PCP and his Neurologist in regards to this as well.   I advised that if this facial weakness (frowning) were to happen again that the patient should be evaluated in the emergency room. The wife and patient verbalize understanding.

## 2020-06-18 DIAGNOSIS — S32000A Wedge compression fracture of unspecified lumbar vertebra, initial encounter for closed fracture: Secondary | ICD-10-CM | POA: Insufficient documentation

## 2020-06-18 DIAGNOSIS — E785 Hyperlipidemia, unspecified: Secondary | ICD-10-CM | POA: Insufficient documentation

## 2020-06-18 DIAGNOSIS — K429 Umbilical hernia without obstruction or gangrene: Secondary | ICD-10-CM | POA: Insufficient documentation

## 2020-06-18 DIAGNOSIS — J189 Pneumonia, unspecified organism: Secondary | ICD-10-CM | POA: Insufficient documentation

## 2020-06-18 DIAGNOSIS — I639 Cerebral infarction, unspecified: Secondary | ICD-10-CM | POA: Insufficient documentation

## 2020-06-21 ENCOUNTER — Other Ambulatory Visit: Payer: Self-pay | Admitting: Cardiology

## 2020-06-21 NOTE — Telephone Encounter (Signed)
Prescription refill request for Xarelto received.  Indication:pfo Last office visit:1/22 [place] Weight:107.4 kg Age:75 Scr:1.1 11/21 CrCl:88.14  ml/min  Prescription refilled

## 2020-06-27 NOTE — Progress Notes (Signed)
Cardiology Office Note:    Date:  06/28/2020   ID:  Troy Bentley, DOB 1946-01-23, MRN [id number]  PCP:  Raina Mina., MD  Cardiologist:  Shirlee More, MD    Referring MD: Raina Mina., MD    ASSESSMENT:    1. Shortness of breath   2. Cerebrovascular accident (CVA), unspecified mechanism ([place])   3. Chronic anticoagulation    PLAN:    In order of problems listed above:  1. Clinically his shortness of breath and edema imply heart failure but this could also be pseudoheart failure from his sleep apnea.  [name] put him on a low-dose loop diuretic check labs including BMP proBNP chest x-ray and echocardiogram to decide whether to continue diuretic therapy.  They are very concerned about his urinary symptoms he has no hesitancy he has urgency he is taking Proscar and I directed him to urology for any other questions this is outside of my scope of practice.  I am not sure how to interpret his frowning unless it is a symptom of heart failure. 2. Continue current treatment including long-term anticoagulation with PFO and statin   Next appointment: 4 to 6 weeks   Medication Adjustments/Labs and Tests Ordered: Current medicines are reviewed at length with the patient today.  Concerns regarding medicines are outlined above.  Orders Placed This Encounter  Procedures  . DG Chest 2 View  . Basic metabolic panel  . Pro b natriuretic peptide (BNP)  . EKG 12-Lead  . ECHOCARDIOGRAM COMPLETE   Meds ordered this encounter  Medications  . furosemide (LASIX) 20 MG tablet    Sig: Take 1 tablet (20 mg total) by mouth daily.    Dispense:  90 tablet    Refill:  3    No chief complaint on file.   History of Present Illness:    Troy Bentley is a 75 y.o. male with a hx of stroke patent foramen ovale chronic anticoagulation hyperlipidemia and aneurysm left artery left popliteal artery followed by vascular surgery last seen 04/17/2019.  His implanted loop recorder his last download  reviewed by me 06/11/2019 showed no episodes of atrial fibrillation flutter or bradycardia. Compliance with diet, lifestyle and medications: Yes  His wife called the office she had multiple complaints including shortness of breath fatigue wheezing and facial frowning. He has no history of heart failure she is unaware he is greater than 4+ peripheral edema. He has sleep apnea and struggles to tolerate CPAP and sleeps in a recliner. No chest pain orthopnea palpitation or syncope. EKG confirms sinus rhythm Past Medical History:  Diagnosis Date  . BPH (benign prostatic hyperplasia)   . Compression fracture of lumbar vertebra ([place])   . Hernia, umbilical   . Hyperlipemia   . Kidney stone   . Pneumonia   . Stroke [facility])     Past Surgical History:  Procedure Laterality Date  . BACK SURGERY  2005  . EYE SURGERY     bilateral cataract surgery  . LOOP RECORDER INSERTION N/A 10/08/2016   Procedure: Loop Recorder Insertion;  Surgeon: Constance Haw, MD;  Location: [place] [facility];  Service: Cardiovascular;  Laterality: N/A;  . ORIF CLAVICULAR FRACTURE  04/15/2012   Procedure: OPEN REDUCTION INTERNAL FIXATION (ORIF) CLAVICULAR FRACTURE;  Surgeon: Augustin Schooling, MD;  Location: [place];  Service: Orthopedics;  Laterality: Left;  . TEE WITHOUT CARDIOVERSION N/A 09/17/2016   Procedure: TRANSESOPHAGEAL ECHOCARDIOGRAM (TEE);  Surgeon: Larey Dresser, MD;  Location: [facility] ENDOSCOPY;  Service: Cardiovascular;  Laterality: N/A;    Current Medications: Current Meds  Medication Sig  . Ascorbic Acid (VITAMIN C) 1000 MG tablet Take 1,000 mg by mouth daily.   . finasteride (PROSCAR) 5 MG tablet TK 1 T PO D  . fluticasone (FLONASE) 50 MCG/ACT nasal spray Place 1 spray into both nostrils daily.  . furosemide (LASIX) 20 MG tablet Take 1 tablet (20 mg total) by mouth daily.  . Multiple Vitamin (MULTIVITAMIN WITH MINERALS) TABS Take 1 tablet by mouth daily.  . Multiple Vitamins-Minerals (MULTIVITAMIN) LIQD  Take 20 mLs by mouth daily. Sea Aloe multivitamin liquid  . polyethylene glycol (MIRALAX / GLYCOLAX) packet Take 17 g by mouth daily.   . pravastatin (PRAVACHOL) 80 MG tablet Take 80 mg by mouth at bedtime.   Alveda Reasons 20 MG TABS tablet TAKE 1 TABLET(20 MG) BY MOUTH DAILY WITH SUPPER     Allergies:   Fenofibrate   Social History   Socioeconomic History  . Marital status: Married    Spouse name: Not on file  . Number of children: Not on file  . Years of education: Not on file  . Highest education level: Not on file  Occupational History  . Not on file  Tobacco Use  . Smoking status: Former Smoker    Types: Cigarettes  . Smokeless tobacco: Never Used  . Tobacco comment: quit 1991  Vaping Use  . Vaping Use: Never used  Substance and Sexual Activity  . Alcohol use: No  . Drug use: No  . Sexual activity: Not on file  Other Topics Concern  . Not on file  Social History Narrative  . Not on file   Social Determinants of Health   Financial Resource Strain: Not on file  Food Insecurity: Not on file  Transportation Needs: Not on file  Physical Activity: Not on file  Stress: Not on file  Social Connections: Not on file     Family History: The patient's family history includes Cancer in his father; Hyperlipidemia in his mother. ROS:   Please see the history of present illness.    All other systems reviewed and are negative.  EKGs/Labs/Other Studies Reviewed:    The following studies were reviewed today:  EKG:  EKG ordered today and personally reviewed.  The ekg ordered today demonstrates sinus rhythm 53 bpm otherwise normal  Recent Labs: None available  Physical Exam:    VS:  BP 120/77 (BP Location: Left Arm, Patient Position: Sitting)   Pulse (!) 52   Ht 5\' 11"  (1.803 m)   Wt 249 lb 1.9 oz (113 kg)   SpO2 95%   BMI 34.75 kg/m     Wt Readings from Last 3 Encounters:  06/28/20 249 lb 1.9 oz (113 kg)  04/17/19 236 lb 12.8 oz (107.4 kg)  10/05/18 241 lb (109.3  kg)     GEN:  Well nourished, well developed in no acute distress HEENT: Normal NECK: No JVD; No carotid bruits LYMPHATICS: No lymphadenopathy CARDIAC: RRR, no murmurs, rubs, gallops RESPIRATORY:  Clear to auscultation without rales, wheezing or rhonchi  ABDOMEN: Soft, non-tender, non-distended MUSCULOSKELETAL: 4+ or greater bilateral lower extremity pitting edema; No deformity  SKIN: Warm and dry NEUROLOGIC:  Alert and oriented x 3 PSYCHIATRIC:  Normal affect    Signed, Shirlee More, MD  06/28/2020 12:12 PM    [place]

## 2020-06-28 ENCOUNTER — Other Ambulatory Visit: Payer: Self-pay

## 2020-06-28 ENCOUNTER — Ambulatory Visit (HOSPITAL_BASED_OUTPATIENT_CLINIC_OR_DEPARTMENT_OTHER)
Admission: RE | Admit: 2020-06-28 | Discharge: 2020-06-28 | Disposition: A | Discharge status: Home / Self Care | Payer: PPO | Source: Ambulatory Visit | Attending: Cardiology | Admitting: Cardiology

## 2020-06-28 ENCOUNTER — Ambulatory Visit: Payer: PPO | Admitting: Cardiology

## 2020-06-28 VITALS — BP 120/77 | HR 52 | Ht 71.0 in | Wt 249.1 lb

## 2020-06-28 DIAGNOSIS — R0602 Shortness of breath: Secondary | ICD-10-CM | POA: Diagnosis not present

## 2020-06-28 DIAGNOSIS — Z7901 Long term (current) use of anticoagulants: Secondary | ICD-10-CM

## 2020-06-28 DIAGNOSIS — I639 Cerebral infarction, unspecified: Secondary | ICD-10-CM | POA: Diagnosis not present

## 2020-06-28 MED ORDER — FUROSEMIDE 20 MG PO TABS: 20.0000 mg | ORAL_TABLET | Freq: Every day | ORAL | 3 refills | Status: DC

## 2020-06-28 NOTE — Patient Instructions (Signed)
Medication Instructions:  Your physician has recommended you make the following change in your medication:  START: Furosemide 20 mg take one tablet by mouth daily/  *If you need a refill on your cardiac medications before your next appointment, please call your pharmacy*   Lab Work: Your physician recommends that you return for lab work in: TODAY BMP, ProBNP If you have labs (blood work) drawn today and your tests are completely normal, you will receive your results only by: [name] MyChart Message (if you have MyChart) OR . A paper copy in the mail If you have any lab test that is abnormal or we need to change your treatment, we will call you to review the results.   Testing/Procedures: Your physician has requested that you have an echocardiogram. Echocardiography is a painless test that uses sound waves to create images of your heart. It provides your doctor with information about the size and shape of your heart and how well your heart's chambers and valves are working. This procedure takes approximately one hour. There are no restrictions for this procedure.  We have put in the order for you to have a chest x-ray completed. You can go downstairs to have this done now.    Follow-Up: At [facility], you and your health needs are our priority.  As part of our continuing mission to provide you with exceptional heart care, we have created designated Provider Care Teams.  These Care Teams include your primary Cardiologist (physician) and Advanced Practice Providers (APPs -  Physician Assistants and Nurse Practitioners) who all work together to provide you with the care you need, when you need it.  We recommend signing up for the patient portal called "MyChart".  Sign up information is provided on this After Visit Summary.  MyChart is used to connect with patients for Virtual Visits (Telemedicine).  Patients are able to view lab/test results, encounter notes, upcoming appointments, etc.  Non-urgent  messages can be sent to your provider as well.   To learn more about what you can do with MyChart, go to [email or web address].    Your next appointment:   4 week(s)  The format for your next appointment:   In Person  Provider:   Shirlee More, MD   Other Instructions

## 2020-06-29 LAB — BASIC METABOLIC PANEL
BUN/Creatinine Ratio: 16 (ref 10–24)
BUN: 17 mg/dL (ref 8–27)
CO2: 21 mmol/L (ref 20–29)
Calcium: 8.8 mg/dL (ref 8.6–10.2)
Chloride: 101 mmol/L (ref 96–106)
Creatinine, Ser: 1.04 mg/dL (ref 0.76–1.27)
Glucose: 83 mg/dL (ref 65–99)
Potassium: 4.3 mmol/L (ref 3.5–5.2)
Sodium: 137 mmol/L (ref 134–144)
eGFR: 75 mL/min/{1.73_m2} (ref >59)

## 2020-06-29 LAB — PRO B NATRIURETIC PEPTIDE: NT-Pro BNP: 48 pg/mL (ref 0–486)

## 2020-07-01 ENCOUNTER — Telehealth: Payer: Self-pay

## 2020-07-01 NOTE — Telephone Encounter (Signed)
Spoke with patient regarding results and recommendation.  Patient verbalizes understanding and is agreeable to plan of care. Advised patient to call back with any issues or concerns.  

## 2020-07-01 NOTE — Telephone Encounter (Signed)
-----   Message from Richardo Priest, MD sent at 06/28/2020  6:15 PM EDT ----- Good report chest x-ray is normal

## 2020-07-03 DIAGNOSIS — R5381 Other malaise: Secondary | ICD-10-CM | POA: Diagnosis not present

## 2020-07-03 DIAGNOSIS — R4189 Other symptoms and signs involving cognitive functions and awareness: Secondary | ICD-10-CM | POA: Diagnosis not present

## 2020-07-03 DIAGNOSIS — R5383 Other fatigue: Secondary | ICD-10-CM | POA: Diagnosis not present

## 2020-07-03 DIAGNOSIS — Z8546 Personal history of malignant neoplasm of prostate: Secondary | ICD-10-CM | POA: Diagnosis not present

## 2020-07-03 DIAGNOSIS — I724 Aneurysm of artery of lower extremity: Secondary | ICD-10-CM | POA: Diagnosis not present

## 2020-07-03 DIAGNOSIS — Q2572 Congenital pulmonary arteriovenous malformation: Secondary | ICD-10-CM | POA: Diagnosis not present

## 2020-07-03 DIAGNOSIS — R7301 Impaired fasting glucose: Secondary | ICD-10-CM | POA: Diagnosis not present

## 2020-07-03 DIAGNOSIS — N400 Enlarged prostate without lower urinary tract symptoms: Secondary | ICD-10-CM | POA: Diagnosis not present

## 2020-07-03 DIAGNOSIS — I6523 Occlusion and stenosis of bilateral carotid arteries: Secondary | ICD-10-CM | POA: Diagnosis not present

## 2020-07-15 DIAGNOSIS — G4733 Obstructive sleep apnea (adult) (pediatric): Secondary | ICD-10-CM | POA: Diagnosis not present

## 2020-07-25 ENCOUNTER — Telehealth: Payer: Self-pay

## 2020-07-25 ENCOUNTER — Ambulatory Visit (HOSPITAL_COMMUNITY): Payer: PPO | Attending: Cardiology

## 2020-07-25 ENCOUNTER — Other Ambulatory Visit: Payer: Self-pay

## 2020-07-25 DIAGNOSIS — R0602 Shortness of breath: Secondary | ICD-10-CM | POA: Diagnosis not present

## 2020-07-25 LAB — ECHOCARDIOGRAM COMPLETE
Area-P 1/2: 2.6 cm2
P 1/2 time: 1176 msec
S' Lateral: 2.7 cm

## 2020-07-25 MED ORDER — FUROSEMIDE 20 MG PO TABS: 20.0000 mg | ORAL_TABLET | Freq: Every day | ORAL | 3 refills | Status: DC | PRN

## 2020-07-25 NOTE — Telephone Encounter (Signed)
Spoke with patient regarding results and recommendation.  Patient verbalizes understanding and is agreeable to plan of care. Advised patient to call back with any issues or concerns.  

## 2020-07-25 NOTE — Telephone Encounter (Signed)
-----   Message from Richardo Priest, MD sent at 07/25/2020 11:53 AM EDT ----- Both his proBNP level and echocardiogram were normal. I do not think he has congestive heart failure. I would tell him to take his diuretic daily furosemide 20 mg as needed.

## 2020-07-25 NOTE — Progress Notes (Signed)
Cardiology Office Note:    Date:  07/26/2020   ID:  Troy Bentley, DOB September 02, 1945, MRN [id number]  PCP:  Raina Mina., MD  Cardiologist:  Shirlee More, MD    Referring MD: Raina Mina., MD    ASSESSMENT:    1. Shortness of breath   2. Cerebrovascular accident (CVA), unspecified mechanism ([place])   3. Chronic anticoagulation    PLAN:    In order of problems listed above:  1. Shortness of breath and edema do not appear to be heart failure his legs are improved taking a small dose of loop diuretic I think he should continue the same with arrangements for follow-up lipid profile next week. 2. He is anticoagulated with a combination of a PFO venous thromboembolism pulmonary embolism continue his current anticoagulant and lipid-lowering pravastatin   Next appointment: 6 months   Medication Adjustments/Labs and Tests Ordered: Current medicines are reviewed at length with the patient today.  Concerns regarding medicines are outlined above.  No orders of the defined types were placed in this encounter.  No orders of the defined types were placed in this encounter.   Chief Complaint  Patient presents with  . Follow-up    After echo    History of Present Illness:    Troy Bentley is a 75 y.o. male with a hx of stroke patent foramen ovale chronic anticoagulation hyperlipidemia and aneurysm left artery left popliteal artery followed by vascular surgery  last seen 06/28/2020 for dyspnea.  Compliance with diet, lifestyle and medications: Yes  He is improved edema is lessened and he is not short of breath. I suspect that this is a consequence of venous thromboembolism when he had pulmonary embolism in 2018.  Fortunately does not have pulmonary hypertension a diuretic is helped him and will continue 20 mg furosemide daily with arrangements for lab work with his PCP next week.  I reviewed the results of the echocardiogram and proBNP level with the patient and his wife  Echo  07/25/2020: 1. Left ventricular ejection fraction, by estimation, is 60 to 65%. The  left ventricle has normal function. The left ventricle has no regional  wall motion abnormalities. There is moderate left ventricular hypertrophy.  Left ventricular diastolic  parameters are consistent with Grade I diastolic dysfunction (impaired  relaxation).  2. Right ventricular systolic function is normal. The right ventricular  size is mildly enlarged. There is normal pulmonary artery systolic  pressure. The estimated right ventricular systolic pressure is 37.6 mmHg.  3. The mitral valve is normal in structure. Trivial mitral valve  regurgitation.  4. The aortic valve is tricuspid. Aortic valve regurgitation is mild.  Mild aortic valve sclerosis is present, with no evidence of aortic valve  Stenosis.   Ref Range & Units 3 wk ago  NT-Pro BNP 0 - 486 pg/mL 48    CXR: IMPRESSION: Negative for acute cardiopulmonary disease.  Past Medical History:  Diagnosis Date  . BPH (benign prostatic hyperplasia)   . Compression fracture of lumbar vertebra ([place])   . Hernia, umbilical   . Hyperlipemia   . Kidney stone   . Pneumonia   . Stroke [facility])     Past Surgical History:  Procedure Laterality Date  . BACK SURGERY  2005  . EYE SURGERY     bilateral cataract surgery  . LOOP RECORDER INSERTION N/A 10/08/2016   Procedure: Loop Recorder Insertion;  Surgeon: Constance Haw, MD;  Location: [place] [facility];  Service: Cardiovascular;  Laterality:  N/A;  . ORIF CLAVICULAR FRACTURE  04/15/2012   Procedure: OPEN REDUCTION INTERNAL FIXATION (ORIF) CLAVICULAR FRACTURE;  Surgeon: Augustin Schooling, MD;  Location: [place];  Service: Orthopedics;  Laterality: Left;  . TEE WITHOUT CARDIOVERSION N/A 09/17/2016   Procedure: TRANSESOPHAGEAL ECHOCARDIOGRAM (TEE);  Surgeon: Larey Dresser, MD;  [facility] ENDOSCOPY;  Service: Cardiovascular;  Laterality: N/A;    Current Medications: Current Meds  Medication  Sig  . Ascorbic Acid (VITAMIN C) 1000 MG tablet Take 1,000 mg by mouth daily.   . finasteride (PROSCAR) 5 MG tablet TK 1 T PO D  . fluticasone (FLONASE) 50 MCG/ACT nasal spray Place 1 spray into both nostrils daily.  . furosemide (LASIX) 20 MG tablet Take 1 tablet (20 mg total) by mouth daily as needed for edema.  . Multiple Vitamin (MULTIVITAMIN WITH MINERALS) TABS Take 1 tablet by mouth daily.  . Multiple Vitamins-Minerals (MULTIVITAMIN) LIQD Take 20 mLs by mouth daily. Sea Aloe multivitamin liquid  . NON FORMULARY Take 1 capsule by mouth 2 (two) times daily. LIFE EXTENSION PROSTATE SUPPORT  . polyethylene glycol (MIRALAX / GLYCOLAX) packet Take 17 g by mouth daily.   . pravastatin (PRAVACHOL) 80 MG tablet Take 80 mg by mouth at bedtime.   Alveda Reasons 20 MG TABS tablet TAKE 1 TABLET(20 MG) BY MOUTH DAILY WITH SUPPER     Allergies:   Fenofibrate   Social History   Socioeconomic History  . Marital status: Married    Spouse name: Not on file  . Number of children: Not on file  . Years of education: Not on file  . Highest education level: Not on file  Occupational History  . Not on file  Tobacco Use  . Smoking status: Former Smoker    Types: Cigarettes  . Smokeless tobacco: Never Used  . Tobacco comment: quit 1991  Vaping Use  . Vaping Use: Never used  Substance and Sexual Activity  . Alcohol use: No  . Drug use: No  . Sexual activity: Not on file  Other Topics Concern  . Not on file  Social History Narrative  . Not on file   Social Determinants of Health   Financial Resource Strain: Not on file  Food Insecurity: Not on file  Transportation Needs: Not on file  Physical Activity: Not on file  Stress: Not on file  Social Connections: Not on file     Family History: The patient's family history includes Cancer in his father; Hyperlipidemia in his mother. ROS:   Please see the history of present illness.    All other systems reviewed and are negative.  EKGs/Labs/Other  Studies Reviewed:    The following studies were reviewed today:    Recent Labs: 06/28/2020: BUN 17; Creatinine, Ser 1.04; NT-Pro BNP 48; Potassium 4.3; Sodium 137  Recent Lipid Panel No results found for: CHOL, TRIG, HDL, CHOLHDL, VLDL, LDLCALC, LDLDIRECT  Physical Exam:    VS:  BP 114/78 (BP Location: Right Arm, Patient Position: Sitting, Cuff Size: Large)   Pulse (!) 58   Ht 5\' 11"  (1.803 m)   Wt 248 lb (112.5 kg)   SpO2 93%   BMI 34.59 kg/m     Wt Readings from Last 3 Encounters:  07/26/20 248 lb (112.5 kg)  06/28/20 249 lb 1.9 oz (113 kg)  04/17/19 236 lb 12.8 oz (107.4 kg)     GEN:  Well nourished, well developed in no acute distress HEENT: Normal NECK: No JVD; No carotid bruits LYMPHATICS: No lymphadenopathy  CARDIAC: RRR, no murmurs, rubs, gallops RESPIRATORY:  Clear to auscultation without rales, wheezing or rhonchi  ABDOMEN: Soft, non-tender, non-distended MUSCULOSKELETAL: He has 1+ bilateral markedly reduced lower extremity edema; No deformity  SKIN: Warm and dry NEUROLOGIC:  Alert and oriented x 3 PSYCHIATRIC:  Normal affect    Signed, Shirlee More, MD  07/26/2020 10:21 AM    [facility]

## 2020-07-26 ENCOUNTER — Encounter: Payer: Self-pay | Admitting: Cardiology

## 2020-07-26 ENCOUNTER — Ambulatory Visit: Payer: PPO | Admitting: Cardiology

## 2020-07-26 VITALS — BP 114/78 | HR 58 | Ht 71.0 in | Wt 248.0 lb

## 2020-07-26 DIAGNOSIS — R0602 Shortness of breath: Secondary | ICD-10-CM

## 2020-07-26 DIAGNOSIS — I639 Cerebral infarction, unspecified: Secondary | ICD-10-CM

## 2020-07-26 DIAGNOSIS — Z7901 Long term (current) use of anticoagulants: Secondary | ICD-10-CM

## 2020-07-26 NOTE — Patient Instructions (Signed)

## 2020-07-29 DIAGNOSIS — I872 Venous insufficiency (chronic) (peripheral): Secondary | ICD-10-CM | POA: Diagnosis not present

## 2020-07-29 DIAGNOSIS — D696 Thrombocytopenia, unspecified: Secondary | ICD-10-CM | POA: Diagnosis not present

## 2020-07-29 DIAGNOSIS — Z8546 Personal history of malignant neoplasm of prostate: Secondary | ICD-10-CM | POA: Diagnosis not present

## 2020-07-29 DIAGNOSIS — I6523 Occlusion and stenosis of bilateral carotid arteries: Secondary | ICD-10-CM | POA: Diagnosis not present

## 2020-07-29 DIAGNOSIS — G4733 Obstructive sleep apnea (adult) (pediatric): Secondary | ICD-10-CM | POA: Diagnosis not present

## 2020-07-29 DIAGNOSIS — Z8673 Personal history of transient ischemic attack (TIA), and cerebral infarction without residual deficits: Secondary | ICD-10-CM | POA: Diagnosis not present

## 2020-07-29 DIAGNOSIS — Z86718 Personal history of other venous thrombosis and embolism: Secondary | ICD-10-CM | POA: Diagnosis not present

## 2020-07-29 DIAGNOSIS — G3184 Mild cognitive impairment, so stated: Secondary | ICD-10-CM | POA: Diagnosis not present

## 2020-07-29 DIAGNOSIS — Q211 Atrial septal defect: Secondary | ICD-10-CM | POA: Diagnosis not present

## 2020-07-29 DIAGNOSIS — E538 Deficiency of other specified B group vitamins: Secondary | ICD-10-CM | POA: Diagnosis not present

## 2020-07-29 DIAGNOSIS — Z0000 Encounter for general adult medical examination without abnormal findings: Secondary | ICD-10-CM | POA: Diagnosis not present

## 2020-07-29 DIAGNOSIS — R471 Dysarthria and anarthria: Secondary | ICD-10-CM | POA: Diagnosis not present

## 2020-07-29 DIAGNOSIS — Z6834 Body mass index (BMI) 34.0-34.9, adult: Secondary | ICD-10-CM | POA: Diagnosis not present

## 2020-07-29 DIAGNOSIS — E6609 Other obesity due to excess calories: Secondary | ICD-10-CM | POA: Diagnosis not present

## 2020-07-29 DIAGNOSIS — N400 Enlarged prostate without lower urinary tract symptoms: Secondary | ICD-10-CM | POA: Diagnosis not present

## 2020-07-29 DIAGNOSIS — Z833 Family history of diabetes mellitus: Secondary | ICD-10-CM | POA: Diagnosis not present

## 2020-07-29 DIAGNOSIS — I724 Aneurysm of artery of lower extremity: Secondary | ICD-10-CM | POA: Diagnosis not present

## 2020-07-29 DIAGNOSIS — R7301 Impaired fasting glucose: Secondary | ICD-10-CM | POA: Diagnosis not present

## 2020-07-29 DIAGNOSIS — Q2572 Congenital pulmonary arteriovenous malformation: Secondary | ICD-10-CM | POA: Diagnosis not present

## 2020-07-29 DIAGNOSIS — D6861 Antiphospholipid syndrome: Secondary | ICD-10-CM | POA: Diagnosis not present

## 2020-07-29 DIAGNOSIS — E7800 Pure hypercholesterolemia, unspecified: Secondary | ICD-10-CM | POA: Diagnosis not present

## 2020-07-30 DIAGNOSIS — N401 Enlarged prostate with lower urinary tract symptoms: Secondary | ICD-10-CM | POA: Diagnosis not present

## 2020-07-30 DIAGNOSIS — N3289 Other specified disorders of bladder: Secondary | ICD-10-CM | POA: Diagnosis not present

## 2020-07-31 DIAGNOSIS — E538 Deficiency of other specified B group vitamins: Secondary | ICD-10-CM | POA: Diagnosis not present

## 2020-09-02 DIAGNOSIS — E538 Deficiency of other specified B group vitamins: Secondary | ICD-10-CM | POA: Diagnosis not present

## 2020-09-19 DIAGNOSIS — E041 Nontoxic single thyroid nodule: Secondary | ICD-10-CM | POA: Diagnosis not present

## 2020-09-19 DIAGNOSIS — Z8673 Personal history of transient ischemic attack (TIA), and cerebral infarction without residual deficits: Secondary | ICD-10-CM | POA: Diagnosis not present

## 2020-09-19 DIAGNOSIS — I724 Aneurysm of artery of lower extremity: Secondary | ICD-10-CM | POA: Diagnosis not present

## 2020-09-19 DIAGNOSIS — E7800 Pure hypercholesterolemia, unspecified: Secondary | ICD-10-CM | POA: Diagnosis not present

## 2020-09-19 DIAGNOSIS — I6523 Occlusion and stenosis of bilateral carotid arteries: Secondary | ICD-10-CM | POA: Diagnosis not present

## 2020-09-19 DIAGNOSIS — Z86711 Personal history of pulmonary embolism: Secondary | ICD-10-CM | POA: Diagnosis not present

## 2020-09-29 DIAGNOSIS — B349 Viral infection, unspecified: Secondary | ICD-10-CM | POA: Diagnosis not present

## 2020-09-29 DIAGNOSIS — J029 Acute pharyngitis, unspecified: Secondary | ICD-10-CM | POA: Diagnosis not present

## 2020-09-29 DIAGNOSIS — Z20822 Contact with and (suspected) exposure to covid-19: Secondary | ICD-10-CM | POA: Diagnosis not present

## 2020-10-02 DIAGNOSIS — J029 Acute pharyngitis, unspecified: Secondary | ICD-10-CM | POA: Insufficient documentation

## 2020-10-02 DIAGNOSIS — J40 Bronchitis, not specified as acute or chronic: Secondary | ICD-10-CM | POA: Diagnosis not present

## 2020-10-02 DIAGNOSIS — Z8673 Personal history of transient ischemic attack (TIA), and cerebral infarction without residual deficits: Secondary | ICD-10-CM | POA: Diagnosis not present

## 2020-10-02 DIAGNOSIS — Z20822 Contact with and (suspected) exposure to covid-19: Secondary | ICD-10-CM | POA: Insufficient documentation

## 2020-11-04 DIAGNOSIS — E538 Deficiency of other specified B group vitamins: Secondary | ICD-10-CM | POA: Diagnosis not present

## 2020-11-04 DIAGNOSIS — E041 Nontoxic single thyroid nodule: Secondary | ICD-10-CM | POA: Diagnosis not present

## 2020-11-04 DIAGNOSIS — E049 Nontoxic goiter, unspecified: Secondary | ICD-10-CM | POA: Diagnosis not present

## 2020-11-22 DIAGNOSIS — Z20822 Contact with and (suspected) exposure to covid-19: Secondary | ICD-10-CM | POA: Diagnosis not present

## 2020-11-22 DIAGNOSIS — U071 COVID-19: Secondary | ICD-10-CM | POA: Diagnosis not present

## 2020-11-22 DIAGNOSIS — Z8616 Personal history of COVID-19: Secondary | ICD-10-CM

## 2020-11-22 HISTORY — DX: Personal history of COVID-19: Z86.16

## 2020-11-25 DIAGNOSIS — G4733 Obstructive sleep apnea (adult) (pediatric): Secondary | ICD-10-CM | POA: Diagnosis not present

## 2020-12-04 DIAGNOSIS — E538 Deficiency of other specified B group vitamins: Secondary | ICD-10-CM | POA: Diagnosis not present

## 2021-01-01 DIAGNOSIS — Z8616 Personal history of COVID-19: Secondary | ICD-10-CM | POA: Diagnosis not present

## 2021-01-01 DIAGNOSIS — Z8673 Personal history of transient ischemic attack (TIA), and cerebral infarction without residual deficits: Secondary | ICD-10-CM | POA: Diagnosis not present

## 2021-01-01 DIAGNOSIS — G4733 Obstructive sleep apnea (adult) (pediatric): Secondary | ICD-10-CM | POA: Diagnosis not present

## 2021-01-01 DIAGNOSIS — D6861 Antiphospholipid syndrome: Secondary | ICD-10-CM | POA: Diagnosis not present

## 2021-01-01 DIAGNOSIS — Z9989 Dependence on other enabling machines and devices: Secondary | ICD-10-CM | POA: Diagnosis not present

## 2021-01-06 DIAGNOSIS — E538 Deficiency of other specified B group vitamins: Secondary | ICD-10-CM | POA: Diagnosis not present

## 2021-01-23 ENCOUNTER — Other Ambulatory Visit: Payer: Self-pay

## 2021-01-24 ENCOUNTER — Encounter: Payer: Self-pay | Admitting: Cardiology

## 2021-01-24 ENCOUNTER — Ambulatory Visit: Payer: PPO | Admitting: Cardiology

## 2021-01-24 ENCOUNTER — Other Ambulatory Visit: Payer: Self-pay

## 2021-01-24 VITALS — BP 122/78 | HR 74 | Ht 71.0 in | Wt 248.8 lb

## 2021-01-24 DIAGNOSIS — E782 Mixed hyperlipidemia: Secondary | ICD-10-CM | POA: Diagnosis not present

## 2021-01-24 DIAGNOSIS — R55 Syncope and collapse: Secondary | ICD-10-CM | POA: Diagnosis not present

## 2021-01-24 DIAGNOSIS — R791 Abnormal coagulation profile: Secondary | ICD-10-CM | POA: Insufficient documentation

## 2021-01-24 DIAGNOSIS — Z7901 Long term (current) use of anticoagulants: Secondary | ICD-10-CM

## 2021-01-24 DIAGNOSIS — I639 Cerebral infarction, unspecified: Secondary | ICD-10-CM

## 2021-01-24 NOTE — Progress Notes (Signed)
Cardiology Office Note:    Date:  01/24/2021   ID:  Troy Bentley, DOB [birth year], MRN [id number]  PCP:  Raina Mina., MD  Cardiologist:  Shirlee More, MD    Referring MD: Raina Mina., MD    ASSESSMENT:    1. Chronic anticoagulation   2. Syncope, unspecified syncope type   3. Cerebrovascular accident (CVA), unspecified mechanism ([place])   4. 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  We will continue his anticoagulant Stable no recurrence Continues long-term anticoagulation Lipids at target continue statin especially with his peripheral arterial disease although vascular surgery [facility]   Next appointment: 9 months   Medication Adjustments/Labs and Tests Ordered: Current medicines are reviewed at length with the patient today.  Concerns regarding medicines are outlined above.  No orders of the defined types were placed in this encounter.  No orders of the defined types were placed in this encounter.   Follow-up for anticoagulation  History of Present Illness:    Troy Bentley is a 75 y.o. male with a hx of  stroke patent foramen ovale chronic anticoagulation hyperlipidemia and aneurysm left artery left popliteal artery followed by vascular surgery  last seen 07/26/2020. 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 Past Medical History:  Diagnosis Date   Actinic keratosis 05/01/2015    Aneurysm of artery of lower extremity ([place]) 11/05/2016   Formatting of this note might be different from the original. [facility] he had aneurysmal update of this with the left being 2.5 cm and he also has 1 to the right.   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   There is mild concentric left ventricular hypertrophy.      Left ventricular systolic function is normal.   LV ejection fraction = 60-65%.      Left ventricular filling pattern is impaired relaxation.   The right ventricle is mildly dilated with normal systolic function.   The left atrial size is normal.   Injection of agitated saline sho   Diaphragmatic hernia 05/01/2015   Dysarthria 01/03/2018   Fracture of acromial end of clavicle 04/15/2012   Hemangioma 8/84/1660   Hernia, umbilical    History of compression fracture of vertebral column 10/07/2016   History of CVA (cerebrovascular accident) 08/29/2016   Overview:  Multiple bilateral cerebellar infarcts, with subacute thalamic infarct  Overview:  Overview:  SUMMARY   The left ventricular size is normal.   There  is mild concentric left ventricular hypertrophy.      Left ventricular systolic function is normal.   LV ejection fraction = 60-65%.      Left ventricular filling pattern is impaired relaxation.   The right ventricle is mildly dilated with    History of prostate cancer 03/21/2018   Formatting of this note might be different from the  original. Follows with Dr. Nila Nephew for this and has FU in april   History of pulmonary embolism 11/11/2016   Hyperlipemia    Hyperlipidemia, unspecified 05/01/2015   Last Assessment & Plan:  Relevant Hx: Course: Daily Update: Today's Plan:he is fasting this am and wanted to have his labs updated back on his meds as he had been off of them when taking the lamisi for his toenail fungus  Electronically signed by: Mayer Camel, NP 05/02/15 1247   IFG (impaired fasting glucose) 05/01/2015   Last Assessment & Plan:  Relevant Hx: Course: Daily Update: Today's Plan:did let him know that acute illnesses can increase his sugar for him and he should be cognizant of this.  Electronically signed by: Mayer Camel, NP 07/19/15 1237   Ingrowing toenail of left foot 04/21/2018   Formatting of this note might be different from the original. 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   Status post placement of implantable loop recorder 10/20/2016   Stroke ([place])    Syncope 06/13/2018   Thrombocytopenia ([place]) 05/01/2015   Thyroid nodule 05/02/2018   Varicocele 05/01/2015    Past Surgical History:  Procedure Laterality Date   BACK SURGERY  2005   EYE SURGERY     bilateral cataract surgery   LOOP RECORDER INSERTION N/A 10/08/2016   Procedure: Loop Recorder Insertion;  Surgeon: Constance Haw, MD;  Location: [place] [facility];  Service: Cardiovascular;  Laterality: N/A;   ORIF CLAVICULAR FRACTURE  04/15/2012   Procedure: OPEN REDUCTION INTERNAL FIXATION (ORIF) CLAVICULAR FRACTURE;  Surgeon: Augustin Schooling, MD;  Location: [place];  Service:  Orthopedics;  Laterality: Left;   TEE WITHOUT CARDIOVERSION N/A 09/17/2016   Procedure: TRANSESOPHAGEAL ECHOCARDIOGRAM (TEE);  Surgeon: Larey Dresser, MD;  Location: [facility] ENDOSCOPY;  Service: Cardiovascular;  Laterality: N/A;    Current Medications: Current Meds  Medication Sig   Ascorbic Acid (VITAMIN C) 1000 MG tablet Take 1,000 mg by mouth daily.    finasteride (PROSCAR) 5 MG tablet Take 5 mg by mouth daily.   fluticasone (FLONASE) 50 MCG/ACT nasal spray Place 1 spray into both nostrils daily.   furosemide (LASIX) 20 MG tablet Take 20 mg by mouth as needed for edema.   Multiple Vitamin (MULTIVITAMIN WITH MINERALS) TABS Take 1 tablet by mouth daily.   Multiple Vitamins-Minerals (MULTIVITAMIN) LIQD Take 20 mLs by mouth daily. Sea Aloe multivitamin liquid   NON FORMULARY Take 1 capsule by mouth 2 (two) times daily. LIFE EXTENSION PROSTATE SUPPORT   polyethylene glycol (MIRALAX / GLYCOLAX) packet Take 17 g by mouth daily.    pravastatin (PRAVACHOL) 80 MG tablet Take 80 mg by mouth at bedtime.    XARELTO 20 MG TABS tablet TAKE 1 TABLET(20  MG) BY MOUTH DAILY WITH SUPPER     Allergies:   Fenofibrate   Social History   Socioeconomic History   Marital status: Married    Spouse name: Not on file   Number of children: Not on file   Years of education: Not on file   Highest education level: Not on file  Occupational History   Not on file  Tobacco Use   Smoking status: Former    Types: Cigarettes   Smokeless tobacco: Never   Tobacco comments:    quit 1991  Vaping Use   Vaping Use: Never used  Substance and Sexual Activity   Alcohol use: No   Drug use: No   Sexual activity: Not on file  Other Topics Concern   Not on file  Social History Narrative   Not on file   Social Determinants of Health   Financial Resource Strain: Not on file  Food Insecurity: Not on file  Transportation Needs: Not on file  Physical Activity: Not on file  Stress: Not on file  Social Connections: Not  on file     Family History: The patient's family history includes Cancer in his father; Hyperlipidemia in his mother. ROS:   Please see the history of present illness.    All other systems reviewed and are negative.  EKGs/Labs/Other Studies Reviewed:    The following studies were reviewed today: See history  Physical Exam:    VS:  BP 122/78   Pulse 74   Ht 5\' 11"  (1.803 m)   Wt 248 lb 12.8 oz (112.9 kg)   SpO2 93%   BMI 34.70 kg/m     Wt Readings from Last 3 Encounters:  01/24/21 248 lb 12.8 oz (112.9 kg)  07/26/20 248 lb (112.5 kg)  06/28/20 249 lb 1.9 oz (113 kg)     GEN: He is gained weight well nourished, well developed in no acute distress HEENT: Normal NECK: No JVD; No carotid bruits LYMPHATICS: No lymphadenopathy CARDIAC: Heart failure bilateral Was normal coronary artery and ECG follow-up with Dr.Cobb follow-up with [place] sleep that was RRR, no murmurs, rubs, gallops RESPIRATORY:  Clear to auscultation without rales, wheezing or rhonchi  ABDOMEN: Soft, non-tender, non-distended MUSCULOSKELETAL:  No edema; No deformity  SKIN: Warm and dry NEUROLOGIC:  Alert and oriented x 3 PSYCHIATRIC:  Normal affect  At his request I examined his testicles he has small varicosities no bleeding points.  Signed, Shirlee More, MD  01/24/2021 9:48 AM    [facility]

## 2021-01-24 NOTE — Patient Instructions (Signed)
Medication Instructions:  Your physician recommends that you continue on your current medications as directed. Please refer to the Current Medication list given to you today.  *If you need a refill on your cardiac medications before your next appointment, please call your pharmacy*   Lab Work: None ordered If you have labs (blood work) drawn today and your tests are completely normal, you will receive your results only by: MyChart Message (if you have MyChart) OR A paper copy in the mail If you have any lab test that is abnormal or we need to change your treatment, we will call you to review the results.   Testing/Procedures: None ordered   Follow-Up: At [facility], you and your health needs are our priority.  As part of our continuing mission to provide you with exceptional heart care, we have created designated Provider Care Teams.  These Care Teams include your primary Cardiologist (physician) and Advanced Practice Providers (APPs -  Physician Assistants and Nurse Practitioners) who all work together to provide you with the care you need, when you need it.  We recommend signing up for the patient portal called "MyChart".  Sign up information is provided on this After Visit Summary.  MyChart is used to connect with patients for Virtual Visits (Telemedicine).  Patients are able to view lab/test results, encounter notes, upcoming appointments, etc.  Non-urgent messages can be sent to your provider as well.   To learn more about what you can do with MyChart, go to [email or web address].    Your next appointment:   9 month(s)  The format for your next appointment:   In Person  Provider:   Brian Munley , MD   Other Instructions NA  

## 2021-01-27 ENCOUNTER — Ambulatory Visit: Payer: PPO | Admitting: Cardiology

## 2021-02-03 DIAGNOSIS — R7301 Impaired fasting glucose: Secondary | ICD-10-CM | POA: Diagnosis not present

## 2021-02-03 DIAGNOSIS — Z23 Encounter for immunization: Secondary | ICD-10-CM | POA: Diagnosis not present

## 2021-02-03 DIAGNOSIS — E7800 Pure hypercholesterolemia, unspecified: Secondary | ICD-10-CM | POA: Diagnosis not present

## 2021-02-03 DIAGNOSIS — D6861 Antiphospholipid syndrome: Secondary | ICD-10-CM | POA: Diagnosis not present

## 2021-02-03 DIAGNOSIS — Z8673 Personal history of transient ischemic attack (TIA), and cerebral infarction without residual deficits: Secondary | ICD-10-CM | POA: Diagnosis not present

## 2021-02-03 DIAGNOSIS — E041 Nontoxic single thyroid nodule: Secondary | ICD-10-CM | POA: Diagnosis not present

## 2021-02-03 DIAGNOSIS — G4733 Obstructive sleep apnea (adult) (pediatric): Secondary | ICD-10-CM | POA: Diagnosis not present

## 2021-02-03 DIAGNOSIS — I724 Aneurysm of artery of lower extremity: Secondary | ICD-10-CM | POA: Diagnosis not present

## 2021-02-03 DIAGNOSIS — D696 Thrombocytopenia, unspecified: Secondary | ICD-10-CM | POA: Diagnosis not present

## 2021-02-03 DIAGNOSIS — Z8616 Personal history of COVID-19: Secondary | ICD-10-CM | POA: Diagnosis not present

## 2021-02-04 DIAGNOSIS — N400 Enlarged prostate without lower urinary tract symptoms: Secondary | ICD-10-CM | POA: Diagnosis not present

## 2021-02-04 DIAGNOSIS — E041 Nontoxic single thyroid nodule: Secondary | ICD-10-CM | POA: Diagnosis not present

## 2021-02-04 DIAGNOSIS — Z86711 Personal history of pulmonary embolism: Secondary | ICD-10-CM | POA: Diagnosis not present

## 2021-02-04 DIAGNOSIS — N401 Enlarged prostate with lower urinary tract symptoms: Secondary | ICD-10-CM | POA: Diagnosis not present

## 2021-02-04 DIAGNOSIS — R7301 Impaired fasting glucose: Secondary | ICD-10-CM | POA: Diagnosis not present

## 2021-02-04 DIAGNOSIS — N3289 Other specified disorders of bladder: Secondary | ICD-10-CM | POA: Diagnosis not present

## 2021-02-04 DIAGNOSIS — E7800 Pure hypercholesterolemia, unspecified: Secondary | ICD-10-CM | POA: Diagnosis not present

## 2021-02-19 DIAGNOSIS — D2239 Melanocytic nevi of other parts of face: Secondary | ICD-10-CM | POA: Diagnosis not present

## 2021-02-19 DIAGNOSIS — D225 Melanocytic nevi of trunk: Secondary | ICD-10-CM | POA: Diagnosis not present

## 2021-02-19 DIAGNOSIS — L570 Actinic keratosis: Secondary | ICD-10-CM | POA: Diagnosis not present

## 2021-02-19 DIAGNOSIS — L821 Other seborrheic keratosis: Secondary | ICD-10-CM | POA: Diagnosis not present

## 2021-03-15 ENCOUNTER — Other Ambulatory Visit: Payer: Self-pay | Admitting: Cardiology

## 2021-03-15 DIAGNOSIS — Z86711 Personal history of pulmonary embolism: Secondary | ICD-10-CM

## 2021-06-16 ENCOUNTER — Other Ambulatory Visit: Payer: Self-pay | Admitting: Cardiology

## 2021-06-16 DIAGNOSIS — Z86711 Personal history of pulmonary embolism: Secondary | ICD-10-CM

## 2021-06-17 NOTE — Telephone Encounter (Signed)
Prescription refill request for Xarelto received.  ?Indication: CVA ?Last office visit:01/24/21 [facility])  ?Weight: 112.9kg ?Age: 76 ?Scr: 1.10 (02/04/21) ?CrCl: 91.78m/min ? ?Appropriate dose and refill sent to requested pharmacy.  ?

## 2021-07-04 ENCOUNTER — Other Ambulatory Visit: Payer: Self-pay

## 2021-07-04 MED ORDER — FUROSEMIDE 20 MG PO TABS: 20.0000 mg | ORAL_TABLET | ORAL | 1 refills | Status: DC | PRN

## 2021-08-05 DIAGNOSIS — L608 Other nail disorders: Secondary | ICD-10-CM | POA: Insufficient documentation

## 2021-09-18 ENCOUNTER — Other Ambulatory Visit: Payer: Self-pay | Admitting: Cardiology

## 2021-09-18 DIAGNOSIS — Z86711 Personal history of pulmonary embolism: Secondary | ICD-10-CM

## 2021-09-18 NOTE — Telephone Encounter (Signed)
Prescription refill request for Xarelto received.  Indication: CVA Last office visit: 01/24/21  Rinaldo Cloud MD Weight: 112.9kg Age: 76 Scr: 1.26 on 09/11/21 CrCl: 79.65  Based on above findings Xarelto '20mg'$  daily is the appropriate dose.  Refill approved.

## 2021-11-23 ENCOUNTER — Other Ambulatory Visit: Payer: Self-pay | Admitting: Oncology

## 2021-11-23 DIAGNOSIS — D696 Thrombocytopenia, unspecified: Secondary | ICD-10-CM

## 2021-11-23 NOTE — Progress Notes (Signed)
[facility]  [address] [place],  [place]  [address] [phone]  Clinic Day:  11/24/2021  Referring physician: Raina Mina., MD   HISTORY OF PRESENT ILLNESS:  The patient is a 76 y.o. male  who I was asked to consult upon for thrombocytopenia.  Recent labs showed a mildly low platelet count of 116.  Of note, his white count was low at 3.8.  As a pertains to his thrombocytopenia, he denies having any bruising/bleeding issues.  This is with him being on Xarelto.  He denies being on any medicines which are known to cause thrombocytopenia or leukopenia.  Bone marrow suppression.  He also denies having any B symptoms which concerning for an underlying hematologic malignancy being his cytopenias.  Overall, he denies there being any significant changes in his health over these past few months.  To his knowledge, there is no family history of any hematologic disorders.  PAST MEDICAL HISTORY:   Past Medical History:  Diagnosis Date  . Actinic keratosis 05/01/2015  . 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 [facility] he had aneurysmal update of this with the left being 2.5 cm and he also has 1 to the right.  . 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 Antiphospholipid syndrome ([place]) 07/08/2017  . Benign neoplasm of colon 05/01/2015  . Bilateral carotid artery stenosis 09/13/2018  . BPH (benign prostatic hyperplasia)   . Chronic anticoagulation 01/12/2018  . Cognitive impairment 07/08/2017  . Compression fracture of lumbar vertebra ([place])   . CVA (cerebrovascular accident) ([place]) 08/29/2016   Overview:  SUMMARY   The left ventricular size is normal.   There is mild concentric left ventricular hypertrophy.      Left ventricular  systolic function is normal.   LV ejection fraction = 60-65%.      Left ventricular filling pattern is impaired relaxation.   The right ventricle is mildly dilated with normal systolic function.   The left atrial size is normal.   Injection of agitated saline sho  . Diaphragmatic hernia 05/01/2015  . Dysarthria 01/03/2018  . Fracture of acromial end of clavicle 04/15/2012  . Hemangioma 05/01/2015  . Hernia, umbilical   . History of compression fracture of vertebral column 10/07/2016  . History of CVA (cerebrovascular accident) 08/29/2016   Overview:  Multiple bilateral cerebellar infarcts, with subacute thalamic infarct  Overview:  Overview:  SUMMARY   The left ventricular size is normal.   There is mild concentric left ventricular hypertrophy.      Left ventricular systolic function is normal.   LV ejection fraction = 60-65%.      Left ventricular filling pattern is impaired relaxation.   The right ventricle is mildly dilated with   . History of prostate cancer 03/21/2018   Formatting of this note might be different from the original. Follows with Dr. Nila Nephew for this and has FU in april  . History of pulmonary embolism 11/11/2016  . Hyperlipemia   . Hyperlipidemia, unspecified 05/01/2015   Last Assessment & Plan:  Relevant Hx: Course: Daily Update: Today's Plan:he is fasting this am and wanted to have his labs updated back on his meds as he had been off of them when taking the lamisi for his toenail fungus  Electronically signed by: Mayer Camel, NP 05/02/15 1247  . IFG (impaired fasting glucose) 05/01/2015   Last Assessment & Plan:  Relevant Hx: Course: Daily Update: Today's Plan:did let him know that acute illnesses can increase his sugar for him and he should be cognizant of this.  Electronically signed by: Mayer Camel, NP 07/19/15 1237  . Ingrowing toenail of left foot 04/21/2018   Formatting of this note might be different from the original. Medial border right great toe  .  Ingrown nail 03/23/2017  . Kidney stone   . Nasal obstruction 03/29/2017  . Obesity 05/01/2015  . Obstructive sleep apnea 12/28/2017  . 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 Personal history of COVID-19 11/22/2020  . PFO (patent foramen ovale) 09/07/2016  . Pneumonia   . 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 Rosacea 05/01/2015  . 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 Status post placement of implantable loop recorder 10/20/2016  . Stroke ([place])   . Syncope 06/13/2018  . Thrombocytopenia ([place]) 05/01/2015  . Thyroid nodule 05/02/2018  . Varicocele 05/01/2015    PAST SURGICAL HISTORY:   Past Surgical History:  Procedure Laterality Date  . BACK SURGERY  2005  . EYE SURGERY     bilateral cataract surgery  . LOOP RECORDER INSERTION N/A 10/08/2016   Procedure: Loop Recorder Insertion;  Surgeon: Constance Haw, MD;  Location: [facility];  Service: Cardiovascular;  Laterality: N/A;  . ORIF CLAVICULAR FRACTURE  04/15/2012   Procedure: OPEN REDUCTION INTERNAL FIXATION (ORIF) CLAVICULAR FRACTURE;  Surgeon: Augustin Schooling, MD;  Location: [facility];  Service: Orthopedics;  Laterality: Left;  . TEE WITHOUT CARDIOVERSION N/A 09/17/2016   Procedure: TRANSESOPHAGEAL ECHOCARDIOGRAM (TEE);  Surgeon: Larey Dresser, MD;  Location: [facility] ENDOSCOPY;  Service: Cardiovascular;  Laterality: N/A;    CURRENT MEDICATIONS:   Current Outpatient Medications  Medication Sig Dispense Refill  . Ascorbic Acid (VITAMIN C) 1000 MG tablet Take 1,000 mg by mouth daily.     . Coenzyme Q10 400 MG CAPS Take 1 capsule by mouth daily.    . finasteride (PROSCAR) 5 MG tablet Take 5 mg by mouth daily.    . fluticasone (FLONASE) 50 MCG/ACT nasal spray Place 1 spray into both nostrils daily.    . furosemide (LASIX) 20 MG tablet Take 1 tablet (20 mg total) by mouth as needed for edema. 90 tablet 1  . Multiple Vitamin (MULTIVITAMIN WITH MINERALS) TABS Take 1 tablet by mouth daily.    . Multiple Vitamins-Minerals  (MULTIVITAMIN) LIQD Take 20 mLs by mouth daily. Sea Aloe multivitamin liquid    . NON FORMULARY Take 1 capsule by mouth 2 (two) times daily. LIFE EXTENSION PROSTATE SUPPORT    . Omega-3 Fatty Acids (OMEGA-3 FISH OIL PO) Take 2 capsules by mouth daily at 12 noon.    . polyethylene glycol (MIRALAX / GLYCOLAX) packet Take 17 g by mouth daily.     . pravastatin (PRAVACHOL) 80 MG tablet Take 80 mg by mouth at bedtime.     Alveda Reasons 20 MG TABS tablet TAKE 1 TABLET(20 MG) BY MOUTH DAILY WITH SUPPER 90 tablet 1   No current facility-administered medications for this visit.    ALLERGIES:   Allergies  Allergen Reactions  . Fenofibrate Other (See Comments)    Increases liver enzymes    FAMILY HISTORY:   Family History  Problem Relation Age of Onset  . Hyperlipidemia Mother   . Diabetes Mother   . Cancer Father  SOCIAL HISTORY:  The patient was born and raised in [place].  He currently lives in [place] with his wife of 19 years.  They have 2 children.  He is involved in sales/packaging work.  He did smoke a half a pack of cigarettes daily for 20 years before quitting 40 years ago.  He denies a history of alcohol abuse.  REVIEW OF SYSTEMS:  Review of Systems  Constitutional:  Negative for fatigue, fever and unexpected weight change.  HENT:   Positive for hearing loss.   Eyes:  Positive for eye problems.  Respiratory:  Positive for shortness of breath. Negative for chest tightness, cough and hemoptysis.   Cardiovascular:  Negative for chest pain and palpitations.  Gastrointestinal:  Positive for constipation. Negative for abdominal distention, abdominal pain, blood in stool, diarrhea, nausea and vomiting.  Genitourinary:  Negative for dysuria, frequency and hematuria.   Musculoskeletal:  Positive for arthralgias. Negative for back pain and myalgias.  Skin:  Negative for itching and rash.  Neurological:  Negative for dizziness, headaches and light-headedness.   Psychiatric/Behavioral:  Negative for depression and suicidal ideas. The patient is not nervous/anxious.      PHYSICAL EXAM:  Blood pressure 127/76, pulse (!) 55, temperature 97.8 F (36.6 C), resp. rate 18, height '5\' 11"'$  (1.803 m), weight 249 lb 3.2 oz (113 kg), SpO2 94 %. Wt Readings from Last 3 Encounters:  12/04/21 246 lb (111.6 kg)  11/24/21 249 lb 3.2 oz (113 kg)  01/24/21 248 lb 12.8 oz (112.9 kg)   Body mass index is 34.76 kg/m. Performance status (ECOG): 1 - Symptomatic but completely ambulatory Physical Exam Constitutional:      Appearance: Normal appearance. He is not ill-appearing.  HENT:     Mouth/Throat:     Mouth: Mucous membranes are moist.     Pharynx: Oropharynx is clear. No oropharyngeal exudate or posterior oropharyngeal erythema.  Cardiovascular:     Rate and Rhythm: Regular rhythm. Bradycardia present.     Heart sounds: No murmur heard.    No friction rub. No gallop.  Pulmonary:     Effort: Pulmonary effort is normal. No respiratory distress.     Breath sounds: Normal breath sounds. No wheezing, rhonchi or rales.  Abdominal:     General: Bowel sounds are normal. There is no distension.     Palpations: Abdomen is soft. There is no mass.     Tenderness: There is no abdominal tenderness.  Musculoskeletal:        General: No swelling.     Right lower leg: No edema.     Left lower leg: No edema.  Lymphadenopathy:     Cervical: No cervical adenopathy.     Upper Body:     Right upper body: No supraclavicular or axillary adenopathy.     Left upper body: No supraclavicular or axillary adenopathy.     Lower Body: No right inguinal adenopathy. No left inguinal adenopathy.  Skin:    General: Skin is warm.     Coloration: Skin is not jaundiced.     Findings: No lesion or rash.  Neurological:     General: No focal deficit present.     Mental Status: He is alert and oriented to person, place, and time. Mental status is at baseline.  Psychiatric:        Mood  and Affect: Mood normal.        Behavior: Behavior normal.        Thought Content: Thought content normal.  LABS:        Latest Ref Rng & Units 11/24/2021   12:00 AM 04/15/2012   10:31 AM  CBC  WBC  3.0     5.3   Hemoglobin 13.5 - 17.5 15.1     15.0   Hematocrit 41 - 53 44     42.9   Platelets 150 - 400 K/uL 139     135      This result is from an external source.      Latest Ref Rng & Units 11/24/2021   12:00 AM 06/28/2020   12:12 PM  CMP  Glucose 65 - 99 mg/dL  83   BUN 4 - '21 19     17   '$ Creatinine 0.6 - 1.3 1.0     1.04   Sodium 137 - 147 137     137   Potassium 3.5 - 5.1 mEq/L 4.3     4.3   Chloride 99 - 108 102     101   CO2 13 - '22 29     21   '$ Calcium 8.7 - 10.7 9.0     8.8   Alkaline Phos 25 - 125 68       AST 14 - 40 49       ALT 10 - 40 U/L 51          This result is from an external source.     ASSESSMENT & PLAN:  A 76 y.o. male who I was asked to consult upon for *** .The patient understands all the plans discussed today and is in agreement with them.  I do appreciate Raina Mina., MD for his new consult.   Renaldo Gornick Macarthur Critchley, MD

## 2021-11-24 ENCOUNTER — Inpatient Hospital Stay: Payer: PPO

## 2021-11-24 ENCOUNTER — Telehealth: Payer: Self-pay | Admitting: Oncology

## 2021-11-24 ENCOUNTER — Inpatient Hospital Stay: Payer: PPO | Attending: Oncology | Admitting: Oncology

## 2021-11-24 ENCOUNTER — Encounter: Payer: Self-pay | Admitting: Oncology

## 2021-11-24 ENCOUNTER — Other Ambulatory Visit: Payer: Self-pay | Admitting: Oncology

## 2021-11-24 VITALS — BP 127/76 | HR 55 | Temp 97.8°F | Resp 18 | Ht 71.0 in | Wt 249.2 lb

## 2021-11-24 DIAGNOSIS — D696 Thrombocytopenia, unspecified: Secondary | ICD-10-CM

## 2021-11-24 DIAGNOSIS — Z87891 Personal history of nicotine dependence: Secondary | ICD-10-CM | POA: Diagnosis not present

## 2021-11-24 DIAGNOSIS — D759 Disease of blood and blood-forming organs, unspecified: Secondary | ICD-10-CM

## 2021-11-24 DIAGNOSIS — D72819 Decreased white blood cell count, unspecified: Secondary | ICD-10-CM | POA: Insufficient documentation

## 2021-11-24 DIAGNOSIS — Z79899 Other long term (current) drug therapy: Secondary | ICD-10-CM | POA: Insufficient documentation

## 2021-11-24 LAB — HEPATIC FUNCTION PANEL
ALT: 51 U/L — AB (ref 10–40)
AST: 49 — AB (ref 14–40)
Alkaline Phosphatase: 68 (ref 25–125)
Bilirubin, Total: 1.8

## 2021-11-24 LAB — VITAMIN B12: Vitamin B-12: 669 pg/mL (ref 180–914)

## 2021-11-24 LAB — CBC: RBC: 4.4 (ref 3.87–5.11)

## 2021-11-24 LAB — IRON AND TIBC
Iron: 113 ug/dL (ref 45–182)
Saturation Ratios: 32 % (ref 17.9–39.5)
TIBC: 353 ug/dL (ref 250–450)
UIBC: 240 ug/dL

## 2021-11-24 LAB — BASIC METABOLIC PANEL
BUN: 19 (ref 4–21)
CO2: 29 — AB (ref 13–22)
Chloride: 102 (ref 99–108)
Creatinine: 1 (ref 0.6–1.3)
Glucose: 108
Potassium: 4.3 mEq/L (ref 3.5–5.1)
Sodium: 137 (ref 137–147)

## 2021-11-24 LAB — TSH: TSH: 1.163 u[IU]/mL (ref 0.350–4.500)

## 2021-11-24 LAB — FERRITIN: Ferritin: 136 ng/mL (ref 24–336)

## 2021-11-24 LAB — COMPREHENSIVE METABOLIC PANEL
Albumin: 4.1 (ref 3.5–5.0)
Calcium: 9 (ref 8.7–10.7)

## 2021-11-24 LAB — CBC AND DIFFERENTIAL
HCT: 44 (ref 41–53)
Hemoglobin: 15.1 (ref 13.5–17.5)
Neutrophils Absolute: 1.44
Platelets: 139 10*3/uL — AB (ref 150–400)
WBC: 3

## 2021-11-24 LAB — FOLATE: Folate: 21.9 ng/mL (ref >5.9)

## 2021-11-24 NOTE — Telephone Encounter (Signed)
11/24/21 NEXT APPT SCHEDULED AND CONFIRMED WITH PATIENT

## 2021-11-28 NOTE — Progress Notes (Deleted)
Cardiology Office Note:    Date:  11/28/2021   ID:  Troy Bentley, DOB 1945/12/11, MRN [id number]  PCP:  Raina Mina., MD  Cardiologist:  Shirlee More, MD    Referring MD: Raina Mina., MD    ASSESSMENT:    No diagnosis found. PLAN:    In order of problems listed above:  ***   Next appointment: ***   Medication Adjustments/Labs and Tests Ordered: Current medicines are reviewed at length with the patient today.  Concerns regarding medicines are outlined above.  No orders of the defined types were placed in this encounter.  No orders of the defined types were placed in this encounter.   No chief complaint on file.   History of Present Illness:    Troy Bentley is a 76 y.o. male with a hx of stroke with patent foramen ovale and chronic anticoagulation hypertension aneurysm of the  left popliteal artery last seen 01/24/2021.  He follows with vascular surgery [place] and he has a 3.5 cm left popliteal aneurysm 2-2 and half centimeter right femoral artery aneurysm and 1/2 to 2 cm right popliteal artery aneurysm.  He was advised surgical intervention. Compliance with diet, lifestyle and medications: ***  Recent labs 11/01/2021 sodium 138 potassium 3.9 creatinine 1.07 his bilirubin was elevated 2.0 Past Medical History:  Diagnosis Date   Actinic keratosis 05/01/2015   Aneurysm of artery of lower extremity ([place]) 11/05/2016   Formatting of this note might be different from the original. [facility] he had aneurysmal update of this with the left being 2.5 cm and he also has 1 to the right.   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   There is mild concentric left ventricular hypertrophy.      Left ventricular systolic function is normal.   LV ejection fraction = 60-65%.      Left ventricular filling pattern is impaired relaxation.   The right ventricle is mildly dilated with normal systolic function.   The left atrial size is normal.   Injection of agitated saline sho   Diaphragmatic hernia 05/01/2015   Dysarthria 01/03/2018   Fracture of acromial end of clavicle 04/15/2012   Hemangioma 2/45/8099   Hernia, umbilical    History of compression fracture of vertebral column 10/07/2016   History of CVA (cerebrovascular accident) 08/29/2016   Overview:  Multiple bilateral cerebellar infarcts, with subacute thalamic infarct  Overview:  Overview:  SUMMARY   The left ventricular size is normal.   There is mild concentric left ventricular hypertrophy.      Left ventricular systolic function is normal.   LV ejection fraction = 60-65%.      Left ventricular filling pattern is impaired relaxation.   The right ventricle is mildly dilated with    History of prostate cancer 03/21/2018   Formatting of this note might be different from the original. Follows with Dr. Nila Nephew for this and has FU in april  History of pulmonary embolism 11/11/2016   Hyperlipemia    Hyperlipidemia, unspecified 05/01/2015   Last Assessment & Plan:  Relevant Hx: Course: Daily Update: Today's Plan:he is fasting this am and wanted to have his labs updated back on his meds as he had been off of them when taking the lamisi for his toenail fungus  Electronically signed by: Mayer Camel, NP 05/02/15 1247   IFG (impaired fasting glucose) 05/01/2015   Last Assessment & Plan:  Relevant Hx: Course: Daily Update: Today's Plan:did let him know that  acute illnesses can increase his sugar for him and he should be cognizant of this.  Electronically signed by: Mayer Camel, NP 07/19/15 1237   Ingrowing toenail of left foot 04/21/2018   Formatting of this note might be different from the original. 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   Status post placement of implantable loop recorder 10/20/2016   Stroke ([place])    Syncope 06/13/2018   Thrombocytopenia ([place]) 05/01/2015   Thyroid nodule 05/02/2018   Varicocele 05/01/2015    Past Surgical History:  Procedure Laterality Date   BACK SURGERY  2005   EYE SURGERY     bilateral cataract surgery   LOOP RECORDER INSERTION N/A 10/08/2016   Procedure: Loop Recorder Insertion;  Surgeon: Constance Haw, MD;  Location: [facility];  Service: Cardiovascular;  Laterality: N/A;   ORIF CLAVICULAR FRACTURE  04/15/2012   Procedure: OPEN REDUCTION INTERNAL FIXATION (ORIF) CLAVICULAR FRACTURE;  Surgeon: Augustin Schooling, MD;  Location: [facility];  Service: Orthopedics;  Laterality: Left;   TEE WITHOUT CARDIOVERSION N/A 09/17/2016   Procedure: TRANSESOPHAGEAL ECHOCARDIOGRAM (TEE);  Surgeon: Larey Dresser, MD;  Location: [facility] ENDOSCOPY;  Service: Cardiovascular;  Laterality: N/A;    Current Medications: No outpatient medications have been marked as taking for the 12/01/21 encounter (Appointment) with Richardo Priest, MD.     Allergies:   Fenofibrate   Social History   Socioeconomic History   Marital status: Married    Spouse name: PAT   Number of children: 2   Years of education: 12 + 4 + SOME COLLEGE   Highest education level: Not on  file  Occupational History   Occupation: RETIRED SALES  Tobacco Use   Smoking status: Former    Types: Cigarettes   Smokeless tobacco: Never   Tobacco comments:    quit 1991  Vaping Use   Vaping Use: Never used  Substance and Sexual Activity   Alcohol use: No   Drug use: No   Sexual activity: Not Currently  Other Topics Concern   Not on file  Social History Narrative   Not on file   Social Determinants of Health   Financial Resource Strain: Not on file  Food Insecurity: Not on file  Transportation Needs: Not on file  Physical Activity: Not on file  Stress: Not on file  Social Connections: Not on file     Family History: The patient's ***family history includes Cancer in his father; Diabetes in his mother; Hyperlipidemia in his mother. ROS:   Please see the history of present illness.  All other systems reviewed and are negative.  EKGs/Labs/Other Studies Reviewed:    The following studies were reviewed today:  EKG:  EKG ordered today and personally reviewed.  The ekg ordered today demonstrates ***  Recent Labs: 11/24/2021: ALT 51; BUN 19; Creatinine 1.0; Hemoglobin 15.1; Platelets 139; Potassium 4.3; Sodium 137; TSH 1.163  Recent Lipid Panel No results found for: "CHOL", "TRIG", "HDL", "CHOLHDL", "VLDL", "LDLCALC", "LDLDIRECT"  Physical Exam:    VS:  There were no vitals taken for this visit.    Wt Readings from Last 3 Encounters:  11/24/21 249 lb 3.2 oz (113 kg)  01/24/21 248 lb 12.8 oz (112.9 kg)  07/26/20 248 lb (112.5 kg)     GEN: *** Well nourished, well developed in no acute distress HEENT: Normal NECK: No JVD; No carotid bruits LYMPHATICS: No lymphadenopathy CARDIAC: ***RRR, no murmurs, rubs, gallops RESPIRATORY:  Clear to auscultation without rales, wheezing or rhonchi  ABDOMEN: Soft, non-tender, non-distended MUSCULOSKELETAL:  No edema; No deformity  SKIN: Warm and dry NEUROLOGIC:  Alert and oriented x 3 PSYCHIATRIC:  Normal affect     Signed, Shirlee More, MD  11/28/2021 3:25 PM    [facility]

## 2021-12-01 ENCOUNTER — Ambulatory Visit: Payer: PPO | Admitting: Cardiology

## 2021-12-03 NOTE — Progress Notes (Unsigned)
Cardiology Office Note:    Date:  12/04/2021   ID:  Troy Bentley, DOB March 14, 1946, MRN [id number]  PCP:  Raina Mina., MD  Cardiologist:  Shirlee More, MD    Referring MD: Raina Mina., MD    ASSESSMENT:    1. Chronic anticoagulation   2. PFO (patent foramen ovale)   3. Mixed hyperlipidemia   4. Popliteal artery aneurysm, bilateral ([place])   5. Aneurysm of left femoral artery (HCC)    PLAN:    In order of problems listed above:  For now continue his current anticoagulant.  Typically in the setting of a cardiac issue like a mechanical valve with an embolic event and effective anticoagulation we had antiplatelet therapy.  His wife will discuss with the ophthalmologist and neurologist at [facility] if they should do this. Continue his current anticoagulant with venous thromboembolism and cryptogenic stroke. Current lipid-lowering treatment LDL is at target I had biased him to proceed with correction of the left popliteal artery aneurysm because of the risk of arterial embolism and he plans to have a coated stent.   Next appointment: 6 months   Medication Adjustments/Labs and Tests Ordered: Current medicines are reviewed at length with the patient today.  Concerns regarding medicines are outlined above.  Orders Placed This Encounter  Procedures   EKG 12-Lead   No orders of the defined types were placed in this encounter. Chief complaint cardiology follow-up   History of Present Illness:    Troy Bentley is a 76 y.o. male with a hx of stroke patent foramen ovale chronic anticoagulation with history of pulmonary embolism hyperlipidemia and peripheral vascular disease with femoral and popliteal aneurysm followed by vascular surgery [facility] last seen 01/24/2021.He follows with vascular surgery [place] and he has a 3.5 cm left popliteal aneurysm 2-2 and half centimeter right femoral artery aneurysm and 1/2 to 2 cm right popliteal artery aneurysm.  He was advised surgical intervention.  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  During hospitalization he had an echocardiogram performed [facility] showing no cardiac source of stroke.  Summary below.  SUMMARY The left ventricular size is normal. There is mild concentric left ventricular hypertrophy.  Left ventricular systolic function is normal. LV ejection fraction = 60-65%.  Left ventricular filling pattern is impaired relaxation. The right ventricle is mildly dilated with normal systolic function. The left atrial size is normal. 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 There is aortic valve sclerosis. The aortic valve opens well. There is no significant valvular stenosis or regurgitation IVC size was moderately dilated. There is no pericardial effusion.  Compliance with diet, lifestyle and medications: Yes  He has an appointment with follow-up with pulmonology at [facility]. I reviewed with him that his records talked about microvascular disease causing cranial nerve palsy but did not exactly use the word stroke Are looking for an answer of whether he needs to take anything different than Xarelto or whether he should take combined Xarelto and antiplatelet therapy. He is having no cardiovascular symptoms of edema shortness of breath chest pain palpitation or syncope  Recent labs 11/01/2021 [facility] PCP: Sodium 138 potassium 3.9 creatinine 1.07 GFR 72 cc/min hemoglobin 15.0 platelets 116,000 02/04/2021 cholesterol 221 LDL 37 non-HDL cholesterol 177 triglycerides 380 HDL 50 Past Medical History:  Diagnosis Date   Actinic keratosis 05/01/2015   Aneurysm of artery of lower extremity ([place]) 11/05/2016   Formatting of this note might be different from the original. [facility] he had aneurysmal update of this with the left being 2.5 cm and he also has 1 to the right.   Aneurysm of left femoral artery (HCC) 11/05/2016   2.5 cm left femoral artery aneurysm as well as right popliteal artery aneurysm followed at [facility] vascular surgery   Antiphospholipid syndrome ([place]) 07/08/2017   Benign neoplasm of colon 05/01/2015   Bilateral carotid artery stenosis 09/13/2018   BPH (benign prostatic hyperplasia)    Chronic anticoagulation 01/12/2018   Cognitive impairment 07/08/2017   Compression fracture of lumbar vertebra (HCC)    CVA (cerebrovascular accident) ([place]) 08/29/2016   Overview:  SUMMARY   The left ventricular size is normal.   There is mild concentric left ventricular hypertrophy.      Left ventricular systolic function is normal.   LV ejection fraction = 60-65%.      Left ventricular filling pattern is impaired relaxation.   The right ventricle is mildly dilated with normal systolic function.   The left atrial size is normal.   Injection of agitated saline sho   Diaphragmatic hernia 05/01/2015   Dysarthria 01/03/2018   Fracture of acromial end of clavicle 04/15/2012   Hemangioma 3/55/9741   Hernia, umbilical    History of compression fracture of vertebral column 10/07/2016   History of CVA (cerebrovascular accident) 08/29/2016   Overview:  Multiple bilateral cerebellar infarcts, with subacute thalamic infarct  Overview:  Overview:  SUMMARY   The left ventricular size is normal.   There is mild concentric left ventricular hypertrophy.      Left ventricular systolic function is normal.   LV ejection fraction = 60-65%.      Left ventricular filling pattern is impaired relaxation.   The right ventricle is mildly dilated with    History of prostate cancer 03/21/2018   Formatting of this note might be different from the original. Follows with Dr. Nila Nephew for this and has FU in april   History of pulmonary embolism 11/11/2016   Hyperlipemia    Hyperlipidemia,  unspecified 05/01/2015   Last Assessment & Plan:  Relevant Hx: Course: Daily Update: Today's Plan:he is fasting this am and wanted to have his labs updated back on his meds as he had been off of them when taking the lamisi for his toenail fungus  Electronically signed by: Mayer Camel, NP 05/02/15 1247   IFG (impaired fasting glucose) 05/01/2015   Last Assessment & Plan:  Relevant Hx: Course: Daily Update: Today's Plan:did let him know that acute illnesses can increase his sugar for him and he should be cognizant of this.  Electronically signed by: Mayer Camel, NP 07/19/15 1237   Ingrowing toenail of left foot 04/21/2018   Formatting of this note might be different from the original. 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   Status post placement of implantable loop recorder 10/20/2016   Stroke [facility])    Syncope 06/13/2018  Thrombocytopenia ([place]) 05/01/2015   Thyroid nodule 05/02/2018   Varicocele 05/01/2015    Past Surgical History:  Procedure Laterality Date   BACK SURGERY  2005   EYE SURGERY     bilateral cataract surgery   LOOP RECORDER INSERTION N/A 10/08/2016   Procedure: Loop Recorder Insertion;  Surgeon: Constance Haw, MD;  Location: [facility] CV [facility];  Service: Cardiovascular;  Laterality: N/A;   ORIF CLAVICULAR FRACTURE  04/15/2012   Procedure: OPEN REDUCTION INTERNAL FIXATION (ORIF) CLAVICULAR FRACTURE;  Surgeon: Augustin Schooling, MD;  Location: [place];  Service: Orthopedics;  Laterality: Left;   TEE WITHOUT CARDIOVERSION N/A 09/17/2016   Procedure: TRANSESOPHAGEAL ECHOCARDIOGRAM (TEE);  Surgeon: Larey Dresser, MD;  Location: [facility] ENDOSCOPY;  Service: Cardiovascular;  Laterality: N/A;    Current Medications: Current Meds  Medication Sig   Ascorbic Acid (VITAMIN C) 1000 MG tablet Take 1,000 mg by mouth daily.    Coenzyme Q10 400 MG CAPS Take 1 capsule by mouth daily.   finasteride (PROSCAR) 5 MG tablet Take 5 mg by mouth daily.   fluticasone (FLONASE) 50 MCG/ACT nasal spray Place 1 spray into both nostrils daily.   furosemide (LASIX) 20 MG tablet Take 1 tablet (20 mg total) by mouth as needed for edema.   Multiple Vitamin (MULTIVITAMIN WITH MINERALS) TABS Take 1 tablet by mouth daily.   Multiple Vitamins-Minerals (MULTIVITAMIN) LIQD Take 20 mLs by mouth daily. [place] multivitamin liquid   NON FORMULARY Take 1 capsule by mouth 2 (two) times daily. LIFE EXTENSION PROSTATE SUPPORT   Omega-3 Fatty Acids (OMEGA-3 FISH OIL PO) Take 2 capsules by mouth daily at 12 noon.   polyethylene glycol (MIRALAX / GLYCOLAX) packet Take 17 g by mouth daily.    pravastatin (PRAVACHOL) 80 MG tablet Take 80 mg by mouth at bedtime.    XARELTO 20 MG TABS tablet TAKE 1 TABLET(20 MG) BY MOUTH DAILY WITH SUPPER     Allergies:   Fenofibrate   Social History   Socioeconomic History   Marital status: Married    Spouse name: PAT   Number of children: 2   Years of education: 12 + 4 + SOME COLLEGE   Highest education level: Not on file  Occupational History   Occupation: RETIRED Press photographer  Tobacco Use   Smoking status: Former    Types: Cigarettes    Passive exposure: Past   Smokeless tobacco: Never   Tobacco comments:    quit 1991  Vaping Use   Vaping Use: Never used  Substance and Sexual Activity   Alcohol use: No   Drug use: No   Sexual activity: Not Currently  Other Topics Concern   Not on file  Social History Narrative   Not on file   Social Determinants of Health   Financial Resource Strain: Not on file  Food Insecurity: Not on file  Transportation Needs: Not on file  Physical Activity: Not  on file  Stress: Not on file  Social Connections: Not on file     Family History: The patient's family history includes Cancer in his father; Diabetes in his mother; Hyperlipidemia in his mother. ROS:   Please see the history of present illness.    All other systems reviewed and are negative.  EKGs/Labs/Other Studies Reviewed:    The following studies were reviewed today:  EKG:  [facility] at [facility] 10/31/2021 showed sinus bradycardia 49 bpm otherwise normal   Physical Exam:    VS:  BP 120/78 (BP Location:  Right Arm, Patient Position: Sitting)   Pulse (!) 55   Ht '5\' 11"'$  (1.803 m)   Wt 246 lb (111.6 kg)   SpO2 95%   BMI 34.31 kg/m     Wt Readings from Last 3 Encounters:  12/04/21 246 lb (111.6 kg)  11/24/21 249 lb 3.2 oz (113 kg)  01/24/21 248 lb 12.8 oz (112.9 kg)     GEN: Patch over the left eye well nourished, well developed in no acute distress HEENT: Normal NECK: No JVD; No carotid bruits LYMPHATICS: No lymphadenopathy CARDIAC: RRR, no murmurs, rubs, gallops RESPIRATORY:  Clear to auscultation without rales, wheezing or rhonchi  ABDOMEN: Soft, non-tender, non-distended MUSCULOSKELETAL:  No edema; No deformity  SKIN: Warm and dry NEUROLOGIC:  Alert and oriented x 3 PSYCHIATRIC:  Normal affect    Signed, Shirlee More, MD  12/04/2021 9:34 AM    [place]

## 2021-12-04 ENCOUNTER — Encounter: Payer: Self-pay | Admitting: Cardiology

## 2021-12-04 ENCOUNTER — Ambulatory Visit: Payer: PPO | Attending: Cardiology | Admitting: Cardiology

## 2021-12-04 VITALS — BP 120/78 | HR 55 | Ht 71.0 in | Wt 246.0 lb

## 2021-12-04 DIAGNOSIS — I724 Aneurysm of artery of lower extremity: Secondary | ICD-10-CM | POA: Diagnosis not present

## 2021-12-04 DIAGNOSIS — Q2112 Patent foramen ovale: Secondary | ICD-10-CM

## 2021-12-04 DIAGNOSIS — Z7901 Long term (current) use of anticoagulants: Secondary | ICD-10-CM | POA: Diagnosis not present

## 2021-12-04 DIAGNOSIS — E782 Mixed hyperlipidemia: Secondary | ICD-10-CM

## 2021-12-04 NOTE — Patient Instructions (Signed)
Medication Instructions:  Your physician recommends that you continue on your current medications as directed. Please refer to the Current Medication list given to you today.  *If you need a refill on your cardiac medications before your next appointment, please call your pharmacy*   Lab Work: None If you have labs (blood work) drawn today and your tests are completely normal, you will receive your results only by: MyChart Message (if you have MyChart) OR A paper copy in the mail If you have any lab test that is abnormal or we need to change your treatment, we will call you to review the results.   Testing/Procedures: None   Follow-Up: At [facility], you and your health needs are our priority.  As part of our continuing mission to provide you with exceptional heart care, we have created designated Provider Care Teams.  These Care Teams include your primary Cardiologist (physician) and Advanced Practice Providers (APPs -  Physician Assistants and Nurse Practitioners) who all work together to provide you with the care you need, when you need it.  We recommend signing up for the patient portal called "MyChart".  Sign up information is provided on this After Visit Summary.  MyChart is used to connect with patients for Virtual Visits (Telemedicine).  Patients are able to view lab/test results, encounter notes, upcoming appointments, etc.  Non-urgent messages can be sent to your provider as well.   To learn more about what you can do with MyChart, go to [email or web address].    Your next appointment:   9 month(s)  The format for your next appointment:   In Person  Provider:   Brian Munley, MD    Other Instructions None  Important Information About Sugar       

## 2021-12-22 ENCOUNTER — Other Ambulatory Visit: Payer: Self-pay | Admitting: Cardiology

## 2021-12-22 NOTE — Telephone Encounter (Signed)
Rx refill sent to pharmacy. 

## 2022-01-23 ENCOUNTER — Other Ambulatory Visit: Payer: Self-pay

## 2022-01-23 DIAGNOSIS — D696 Thrombocytopenia, unspecified: Secondary | ICD-10-CM

## 2022-01-25 NOTE — Progress Notes (Unsigned)
[facility]  [address] [place],  [place]  [address] [phone]  Clinic Day:  11/24/2021  Referring physician: Raina Mina., MD   HISTORY OF PRESENT ILLNESS:  The patient is a 76 y.o. male  who I was asked to consult upon for thrombocytopenia.  Recent labs showed a mildly low platelet count of 116.  Of note, his white count was also low at 3.8.  As it pertains to his thrombocytopenia, he denies having any bruising/bleeding issues.  This is with him being on Xarelto.  He denies being on any medicines which are known to cause thrombocytopenia or leukopenia via bone marrow suppression.  He also denies having any B symptoms which are concerning for an underlying hematologic malignancy being behind his cytopenias.  Overall, he denies there being any significant changes in his health over these past few months.  To his knowledge, there is no family history of any hematologic disorders.  PAST MEDICAL HISTORY:   Past Medical History:  Diagnosis Date   Actinic keratosis 05/01/2015   Aneurysm of artery of lower extremity ([place]) 11/05/2016   Formatting of this note might be different from the original. [facility] he had aneurysmal update of this with the left being 2.5 cm and he also has 1 to the right.   Aneurysm of left femoral artery (HCC) 11/05/2016   2.5 cm left femoral artery aneurysm as well as right popliteal artery aneurysm followed at [facility] vascular surgery   Antiphospholipid syndrome ([place]) 07/08/2017   Benign neoplasm of colon 05/01/2015   Bilateral carotid artery stenosis 09/13/2018   BPH (benign prostatic hyperplasia)    Chronic anticoagulation 01/12/2018   Cognitive impairment 07/08/2017   Compression fracture of lumbar vertebra (HCC)    CVA (cerebrovascular accident) ([place]) 08/29/2016   Overview:  SUMMARY   The left ventricular size is normal.   There is mild concentric left ventricular hypertrophy.      Left  ventricular systolic function is normal.   LV ejection fraction = 60-65%.      Left ventricular filling pattern is impaired relaxation.   The right ventricle is mildly dilated with normal systolic function.   The left atrial size is normal.   Injection of agitated saline sho   Diaphragmatic hernia 05/01/2015   Dysarthria 01/03/2018   Fracture of acromial end of clavicle 04/15/2012   Hemangioma 3/54/6568   Hernia, umbilical    History of compression fracture of vertebral column 10/07/2016   History of CVA (cerebrovascular accident) 08/29/2016   Overview:  Multiple bilateral cerebellar infarcts, with subacute thalamic infarct  Overview:  Overview:  SUMMARY   The left ventricular size is normal.   There is mild concentric left ventricular hypertrophy.      Left ventricular systolic function is normal.   LV ejection fraction = 60-65%.      Left ventricular filling pattern is impaired relaxation.   The right ventricle is mildly dilated with    History of prostate cancer 03/21/2018   Formatting of this note might be different from the original. Follows with Dr. Nila Nephew for this and has FU in april   History of pulmonary embolism 11/11/2016   Hyperlipemia    Hyperlipidemia, unspecified 05/01/2015   Last Assessment & Plan:  Relevant Hx: Course: Daily Update: Today's Plan:he is fasting this am and wanted to have his labs updated back on his meds as he had been off of them when taking the lamisi for  his toenail fungus  Electronically signed by: Mayer Camel, NP 05/02/15 1247   IFG (impaired fasting glucose) 05/01/2015   Last Assessment & Plan:  Relevant Hx: Course: Daily Update: Today's Plan:did let him know that acute illnesses can increase his sugar for him and he should be cognizant of this.  Electronically signed by: Mayer Camel, NP 07/19/15 1237   Ingrowing toenail of left foot 04/21/2018   Formatting of this note might be different from the original. 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   Status post placement of implantable loop recorder 10/20/2016   Stroke ([place])    Syncope 06/13/2018   Thrombocytopenia ([place]) 05/01/2015   Thyroid nodule 05/02/2018   Varicocele 05/01/2015    PAST SURGICAL HISTORY:   Past Surgical History:  Procedure Laterality Date   BACK SURGERY  2005   EYE SURGERY     bilateral cataract surgery   LOOP RECORDER INSERTION N/A 10/08/2016   Procedure: Loop Recorder Insertion;  Surgeon: Constance Haw, MD;  Location: [facility];  Service: Cardiovascular;  Laterality: N/A;   ORIF CLAVICULAR FRACTURE  04/15/2012   Procedure: OPEN REDUCTION INTERNAL FIXATION (ORIF) CLAVICULAR FRACTURE;  Surgeon: Augustin Schooling, MD;  Location: [facility];  Service: Orthopedics;  Laterality: Left;   TEE WITHOUT CARDIOVERSION N/A 09/17/2016   Procedure: TRANSESOPHAGEAL ECHOCARDIOGRAM (TEE);  Surgeon: Larey Dresser, MD;  Location[facility];  Service: Cardiovascular;  Laterality: N/A;    CURRENT MEDICATIONS:   Current Outpatient Medications  Medication Sig Dispense Refill   Ascorbic Acid (VITAMIN C) 1000 MG tablet Take 1,000 mg by mouth daily.      Coenzyme Q10 400 MG CAPS Take 1 capsule by mouth daily.     finasteride (PROSCAR) 5 MG tablet Take 5 mg by mouth daily.     fluticasone (FLONASE) 50 MCG/ACT nasal spray Place 1 spray into both nostrils daily.     furosemide (LASIX) 20 MG tablet TAKE 1 TABLET BY MOUTH DAILY AS NEEDED FOR EDEMA 90 tablet 1   Multiple Vitamin (MULTIVITAMIN WITH MINERALS) TABS Take 1 tablet by mouth daily.     Multiple Vitamins-Minerals (MULTIVITAMIN) LIQD Take 20 mLs by mouth daily.  Sea Aloe multivitamin liquid     NON FORMULARY Take 1 capsule by mouth 2 (two) times daily. LIFE EXTENSION PROSTATE SUPPORT     Omega-3 Fatty Acids (OMEGA-3 FISH OIL PO) Take 2 capsules by mouth daily at 12 noon.     polyethylene glycol (MIRALAX / GLYCOLAX) packet Take 17 g by mouth daily.      pravastatin (PRAVACHOL) 80 MG tablet Take 80 mg by mouth at bedtime.      XARELTO 20 MG TABS tablet TAKE 1 TABLET(20 MG) BY MOUTH DAILY WITH SUPPER 90 tablet 1   No current facility-administered medications for this visit.    ALLERGIES:   Allergies  Allergen Reactions   Fenofibrate Other (See Comments)    Increases liver enzymes    FAMILY HISTORY:   Family History  Problem Relation Age of Onset   Hyperlipidemia Mother    Diabetes Mother    Cancer  Father    SOCIAL HISTORY:  The patient was born and raised in [place].  He currently lives in [place] with his wife of 50 years.  They have 2 children.  He is involved in sales/packaging work.  He did smoke a half a pack of cigarettes daily for 20 years before quitting 40 years ago.  He denies a history of alcohol abuse.  REVIEW OF SYSTEMS:  Review of Systems  Constitutional:  Negative for fatigue, fever and unexpected weight change.  HENT:   Positive for hearing loss.   Eyes:  Positive for eye problems.  Respiratory:  Positive for shortness of breath. Negative for chest tightness, cough and hemoptysis.   Cardiovascular:  Negative for chest pain and palpitations.  Gastrointestinal:  Positive for constipation. Negative for abdominal distention, abdominal pain, blood in stool, diarrhea, nausea and vomiting.  Genitourinary:  Negative for dysuria, frequency and hematuria.   Musculoskeletal:  Positive for arthralgias. Negative for back pain and myalgias.  Skin:  Negative for itching and rash.  Neurological:  Negative for dizziness, headaches and light-headedness.  Psychiatric/Behavioral:  Negative for depression and suicidal ideas. The  patient is not nervous/anxious.      PHYSICAL EXAM:  There were no vitals taken for this visit. Wt Readings from Last 3 Encounters:  12/04/21 246 lb (111.6 kg)  11/24/21 249 lb 3.2 oz (113 kg)  01/24/21 248 lb 12.8 oz (112.9 kg)   There is no height or weight on file to calculate BMI. Performance status (ECOG): 1 - Symptomatic but completely ambulatory Physical Exam Constitutional:      Appearance: Normal appearance. He is not ill-appearing.  HENT:     Mouth/Throat:     Mouth: Mucous membranes are moist.     Pharynx: Oropharynx is clear. No oropharyngeal exudate or posterior oropharyngeal erythema.  Cardiovascular:     Rate and Rhythm: Regular rhythm. Bradycardia present.     Heart sounds: No murmur heard.    No friction rub. No gallop.  Pulmonary:     Effort: Pulmonary effort is normal. No respiratory distress.     Breath sounds: Normal breath sounds. No wheezing, rhonchi or rales.  Abdominal:     General: Bowel sounds are normal. There is no distension.     Palpations: Abdomen is soft. There is no mass.     Tenderness: There is no abdominal tenderness.  Musculoskeletal:        General: No swelling.     Right lower leg: No edema.     Left lower leg: No edema.  Lymphadenopathy:     Cervical: No cervical adenopathy.     Upper Body:     Right upper body: No supraclavicular or axillary adenopathy.     Left upper body: No supraclavicular or axillary adenopathy.     Lower Body: No right inguinal adenopathy. No left inguinal adenopathy.  Skin:    General: Skin is warm.     Coloration: Skin is not jaundiced.     Findings: No lesion or rash.  Neurological:     General: No focal deficit present.     Mental Status: He is alert and oriented to person, place, and time. Mental status is at baseline.  Psychiatric:        Mood and Affect: Mood normal.        Behavior: Behavior normal.        Thought Content: Thought content normal.    LABS:       Latest Reference Range &  Units 11/24/21 14:11  Iron 45 - 182 ug/dL 113  UIBC ug/dL 240  TIBC 250 - 450 ug/dL 353  Saturation Ratios 17.9 - 39.5 % 32  Ferritin 24 - 336 ng/mL 136  Folate >5.9 ng/mL 21.9  Vitamin B12 180 - 914 pg/mL 669    Latest Reference Range & Units 11/24/21 14:11  TSH 0.350 - 4.500 uIU/mL 1.163    ASSESSMENT & PLAN:  A 76 y.o. male who I was asked to consult upon for thrombocytopenia.  Recent labs al so showed him to be mildly leukopenic.  When evaluating his labs today, his platelet count is actually low normal, with his white cells being mildly low.  Overall, none of his peripheral counts is dangerously low.  Labs today also show an elevated bilirubin and AST level, which suggest there may be underlying liver disease, which can lead to cytopenias. Of note, CT scans in 2020 showed no evidence of either cirrhosis or splenomegaly.  For completeness, I will check his B12, folate and iron levels to ensure there are no vitamin deficiencies factoring into his low counts.  His TSH will be checked to ensure severe thyroid disease is not factoring into his low counts.  Per a review of his medication list, he is not on any medications which can cause cytopenias via bone marrow suppression.  For now, as they are very mild, his leukopenia and thrombocytopenia will be followed conservatively.  I will see him back in 2 months to go over his labs today, as well as reassess his peripheral counts at that time.  The patient understands all the plans discussed today and is in agreement with them.  I do appreciate Raina Mina., MD for his new consult.   Sharine Cadle Macarthur Critchley, MD

## 2022-01-26 ENCOUNTER — Telehealth: Payer: Self-pay | Admitting: Oncology

## 2022-01-26 ENCOUNTER — Inpatient Hospital Stay: Payer: PPO

## 2022-01-26 ENCOUNTER — Inpatient Hospital Stay: Payer: PPO | Attending: Oncology | Admitting: Oncology

## 2022-01-26 VITALS — BP 139/85 | HR 53 | Temp 97.7°F | Resp 16 | Ht 71.0 in | Wt 247.5 lb

## 2022-01-26 DIAGNOSIS — Z7901 Long term (current) use of anticoagulants: Secondary | ICD-10-CM | POA: Insufficient documentation

## 2022-01-26 DIAGNOSIS — Z79899 Other long term (current) drug therapy: Secondary | ICD-10-CM | POA: Diagnosis not present

## 2022-01-26 DIAGNOSIS — D696 Thrombocytopenia, unspecified: Secondary | ICD-10-CM | POA: Diagnosis present

## 2022-01-26 DIAGNOSIS — D72819 Decreased white blood cell count, unspecified: Secondary | ICD-10-CM | POA: Insufficient documentation

## 2022-01-26 LAB — CMP (CANCER CENTER ONLY)
ALT: 43 U/L (ref 0–44)
AST: 35 U/L (ref 15–41)
Albumin: 3.7 g/dL (ref 3.5–5.0)
Alkaline Phosphatase: 59 U/L (ref 38–126)
Anion gap: 10 (ref 5–15)
BUN: 23 mg/dL (ref 8–23)
CO2: 29 mmol/L (ref 22–32)
Calcium: 9.1 mg/dL (ref 8.9–10.3)
Chloride: 102 mmol/L (ref 98–111)
Creatinine: 1.03 mg/dL (ref 0.61–1.24)
GFR, Estimated: >60 mL/min (ref >60)
Glucose, Bld: 123 mg/dL — ABNORMAL HIGH (ref 70–99)
Potassium: 4.3 mmol/L (ref 3.5–5.1)
Sodium: 141 mmol/L (ref 135–145)
Total Bilirubin: 1.5 mg/dL — ABNORMAL HIGH (ref 0.3–1.2)
Total Protein: 7.3 g/dL (ref 6.5–8.1)

## 2022-01-26 LAB — TSH: TSH: 1.199 u[IU]/mL (ref 0.350–4.500)

## 2022-01-26 NOTE — Telephone Encounter (Signed)
01/26/22 Next appt scheduled and confirmed with patient 

## 2022-02-03 IMAGING — DX DG CHEST 2V
2 series · 2 of 2 positions shown · non-contrast
Comparison: 08/15/2003

CLINICAL DATA: 75-year-old male with shortness of breath

EXAM:
CHEST - 2 VIEW

[chest pa]
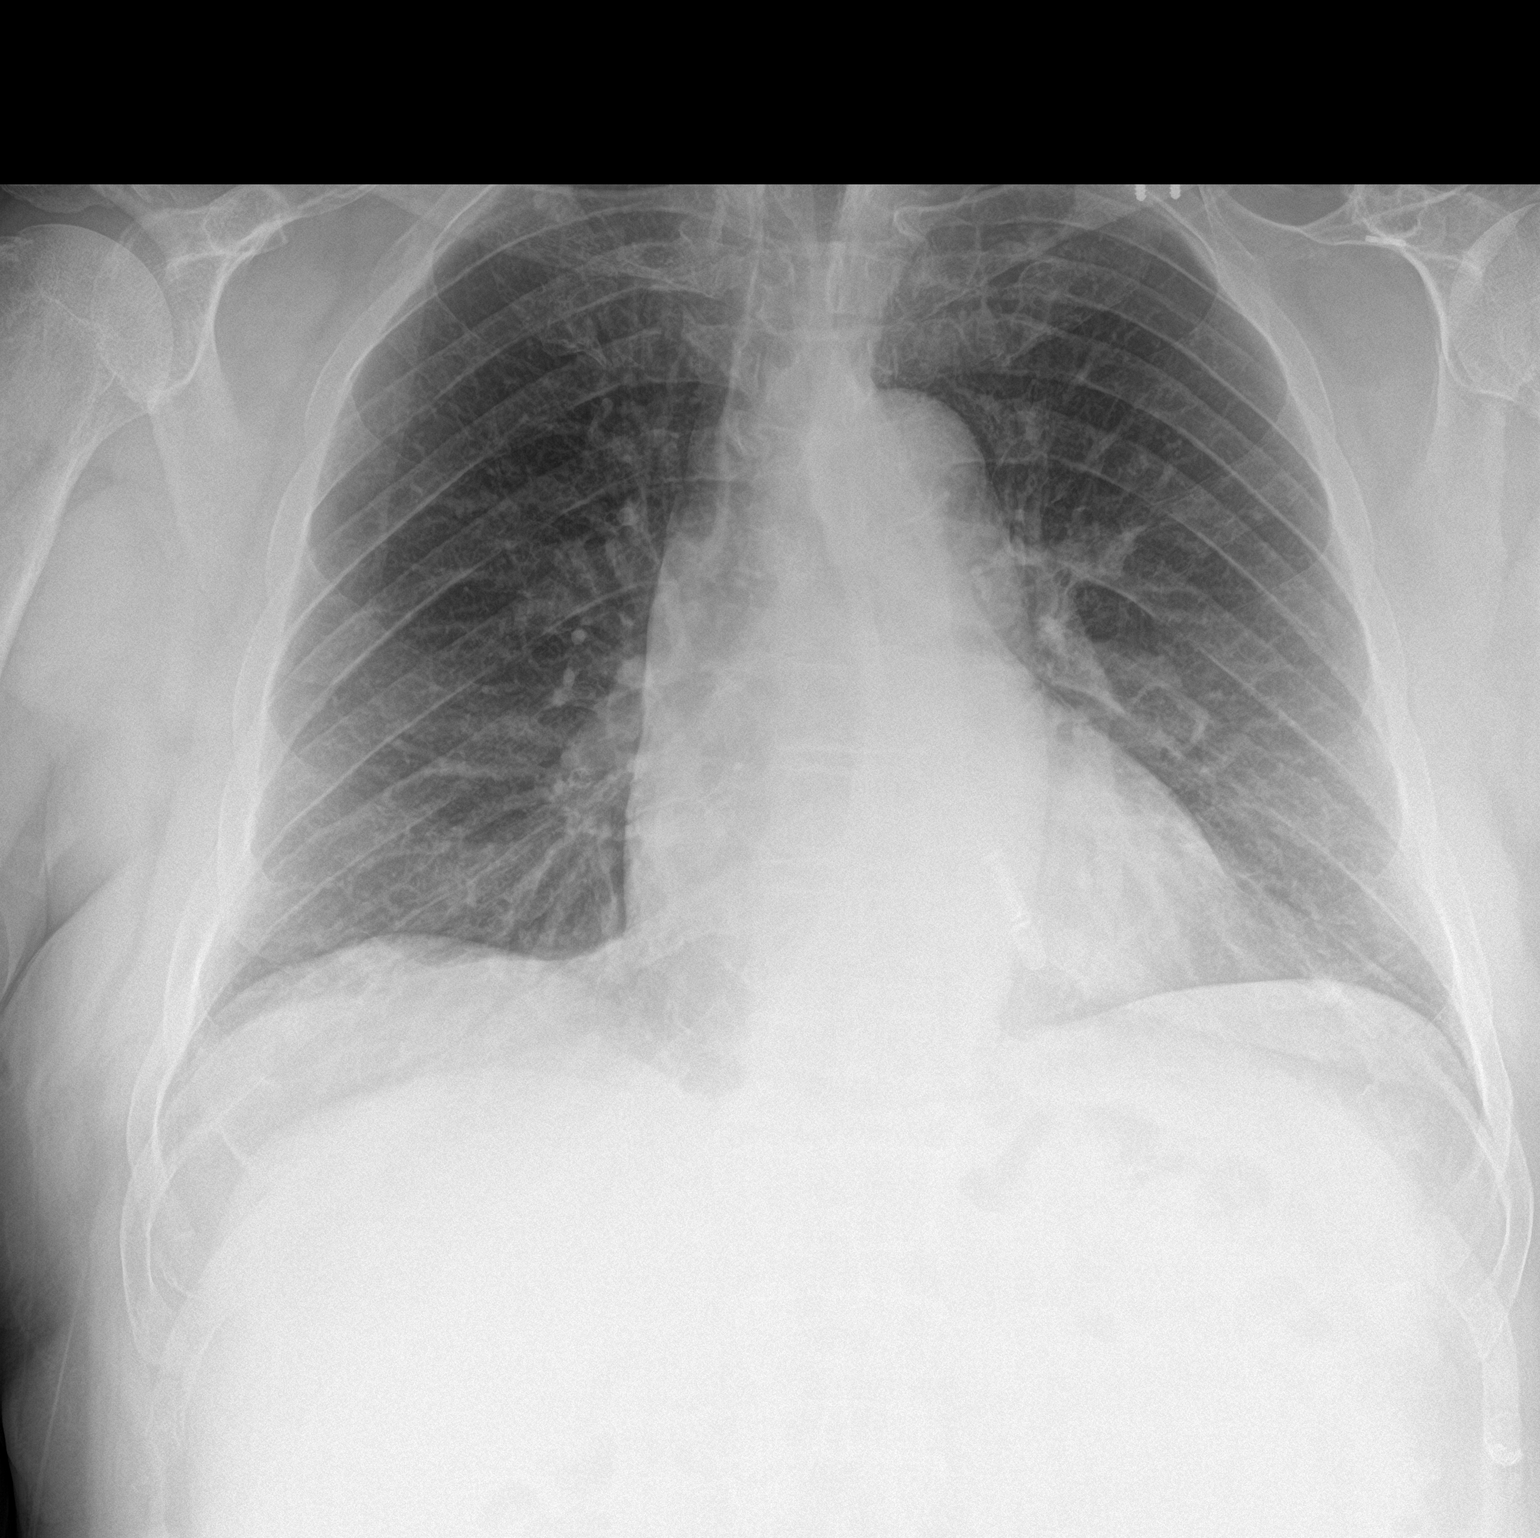

[chest lat]
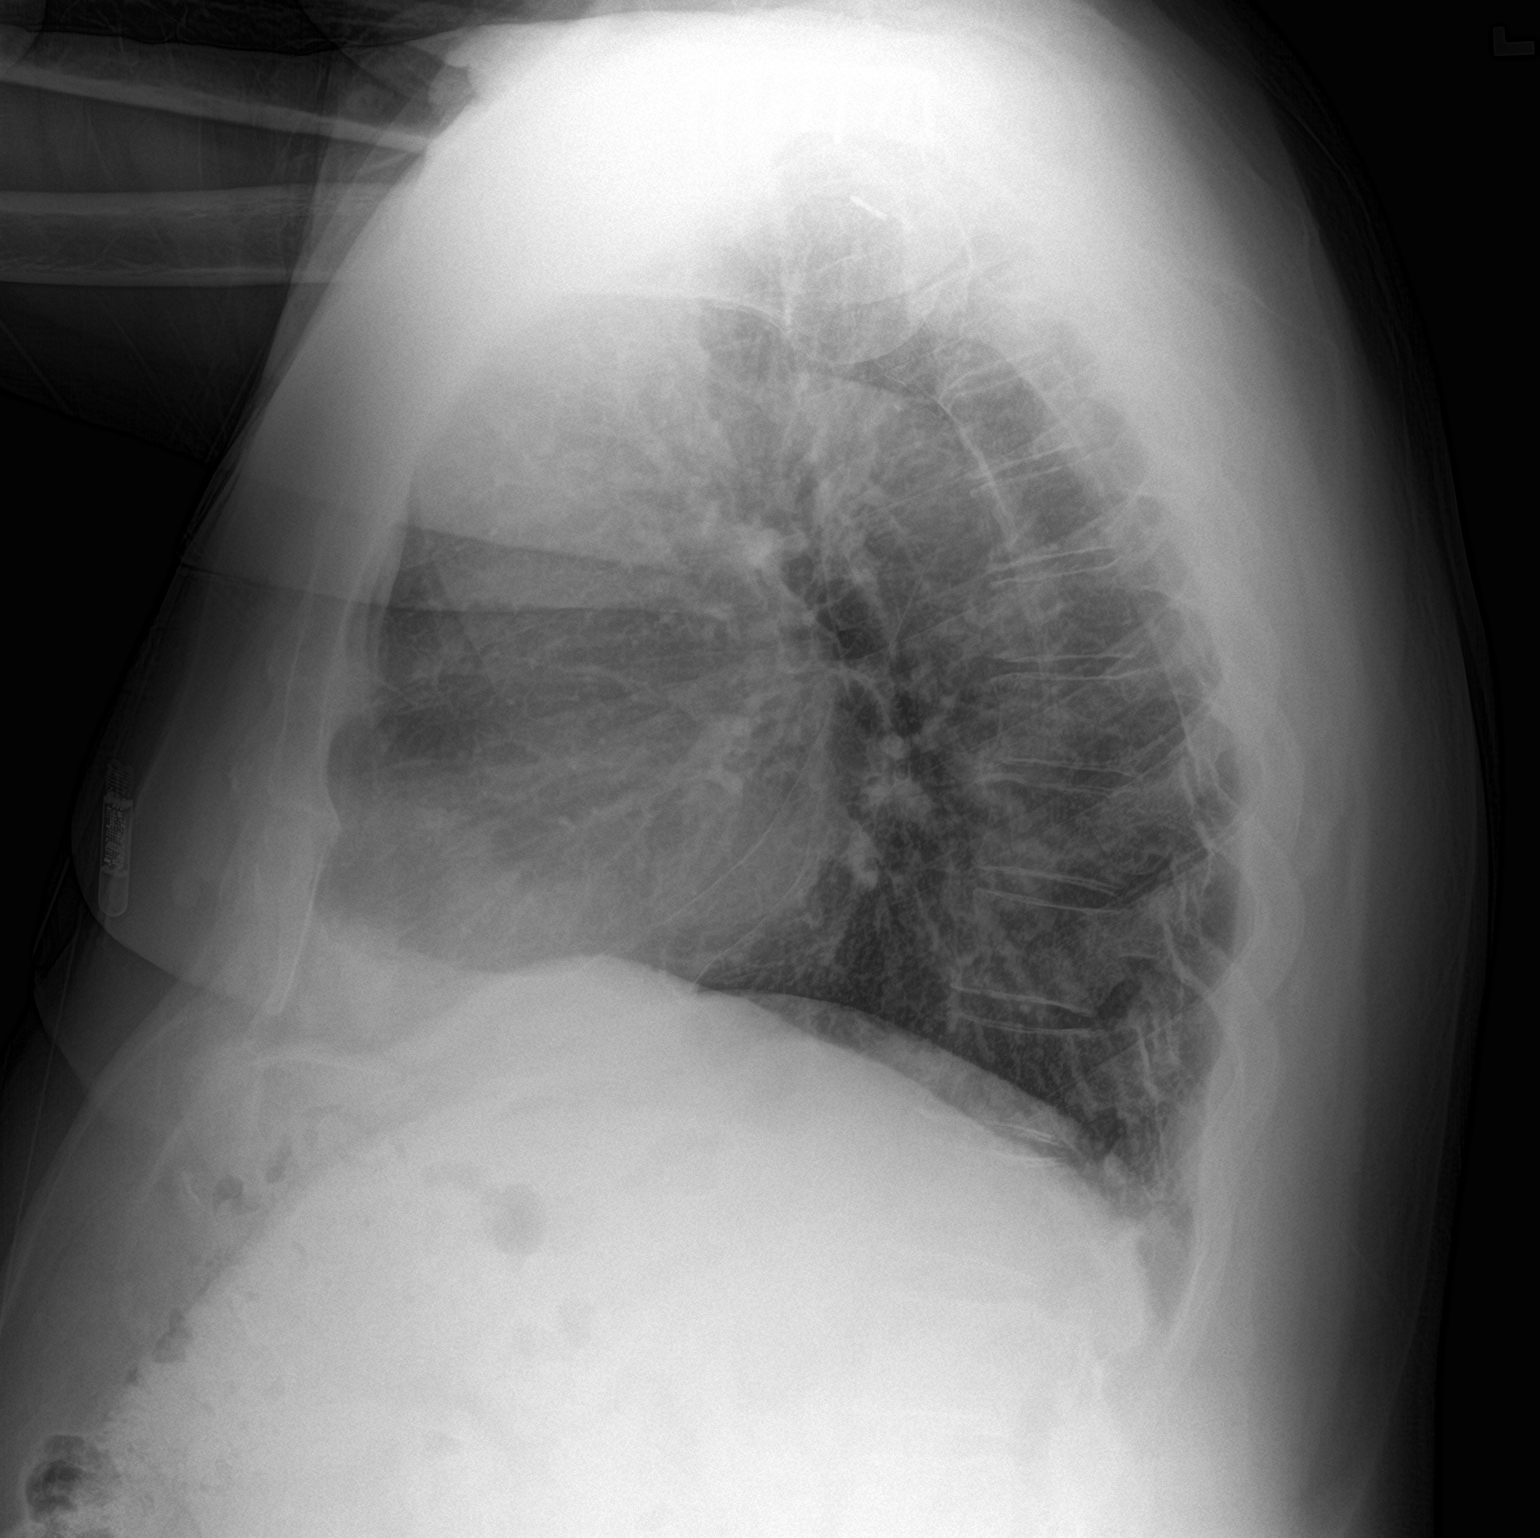

[2 of 2 positions shown; findings below may reference images not displayed]

FINDINGS: Cardiomediastinal silhouette unchanged in size and contour. Event
recorder on the left chest wall is unchanged.

No pneumothorax or pleural effusion. No confluent airspace disease.
Coarsened interstitial markings are similar to the prior. Surgical
changes of the left clavicle.

No displaced fracture.
IMPRESSION: Negative for acute cardiopulmonary disease.

## 2022-04-06 ENCOUNTER — Telehealth: Payer: Self-pay | Admitting: Cardiology

## 2022-04-06 DIAGNOSIS — I259 Chronic ischemic heart disease, unspecified: Secondary | ICD-10-CM

## 2022-04-06 NOTE — Telephone Encounter (Signed)
ED notes in Care Everywhere. Pt had 2 troponin's that were 18. Pt is not having any symptoms at this time. Fraser Din states that the pt was told he needed a stress test to evaluate. Please advise if we can schedule the stress test or if another test is indicated.  Spoke with Dr. Geraldo Pitter who states that we can go ahead and get stress ordered.

## 2022-04-06 NOTE — Telephone Encounter (Signed)
Pt c/o of Chest Pain: STAT if CP now or developed within 24 hours  1. Are you having CP right now?   No  2. Are you experiencing any other symptoms (ex. SOB, nausea, vomiting, sweating)?   No  3. How long have you been experiencing CP?  Early last Thursday morning  4. Is your CP continuous or coming and going?   Continuous for a few minutes, aching pain  5. Have you taken Nitroglycerin?   No   Wife stated patient had a chest pain event and went to [facility] ED.  Wife stated patient had a troponin I test which scored 18.  Wife stated it was recommended that the patient have a stress test and to evaluated by his cardiologist

## 2022-04-07 ENCOUNTER — Telehealth: Payer: Self-pay

## 2022-04-07 NOTE — Telephone Encounter (Signed)
Attempted to contact the patient, detailed instructions left on the patient's wifes answering machine per DPR. Asked to call back with any questions. S.Jaylan Hinojosa EMTP

## 2022-04-09 ENCOUNTER — Ambulatory Visit: Payer: PPO | Attending: Cardiology

## 2022-04-09 DIAGNOSIS — I259 Chronic ischemic heart disease, unspecified: Secondary | ICD-10-CM

## 2022-04-09 LAB — MYOCARDIAL PERFUSION IMAGING
LV dias vol: 100 mL (ref 62–150)
LV sys vol: 43 mL
Nuc Stress EF: 57 %
Peak HR: 65 {beats}/min
Rest HR: 44 {beats}/min
Rest Nuclear Isotope Dose: 10.7 mCi
SDS: 3
SRS: 2
SSS: 5
ST Depression (mm): 0 mm
Stress Nuclear Isotope Dose: 30.6 mCi
TID: 0.97

## 2022-04-09 MED ORDER — TECHNETIUM TC 99M TETROFOSMIN IV KIT
30.6000 | PACK | Freq: Once | INTRAVENOUS | Status: AC | PRN
  Administered 2022-04-09: 30.6 via INTRAVENOUS

## 2022-04-09 MED ORDER — REGADENOSON 0.4 MG/5ML IV SOLN
0.4000 mg | Freq: Once | INTRAVENOUS | Status: AC
  Administered 2022-04-09: 0.4 mg via INTRAVENOUS

## 2022-04-09 MED ORDER — TECHNETIUM TC 99M TETROFOSMIN IV KIT
10.7000 | PACK | Freq: Once | INTRAVENOUS | Status: AC | PRN
  Administered 2022-04-09: 10.7 via INTRAVENOUS

## 2022-05-18 ENCOUNTER — Other Ambulatory Visit: Payer: Self-pay | Admitting: Cardiology

## 2022-06-24 ENCOUNTER — Other Ambulatory Visit: Payer: Self-pay | Admitting: Cardiology

## 2022-06-24 DIAGNOSIS — Z86711 Personal history of pulmonary embolism: Secondary | ICD-10-CM

## 2022-06-24 NOTE — Telephone Encounter (Signed)
Xarelto 20mg  refill request received. Pt is 77 years old, weight-112kg, Crea-1.03 on 01/26/22, last seen by Dr. Dulce Sellar on 12/04/21, Diagnosis-patent foramen ovale & history of pulmonary embolism, CrCl- 95.15 mL/min; Dose is appropriate based on dosing criteria. Will send in refill to requested pharmacy.

## 2022-09-09 NOTE — Progress Notes (Signed)
Cardiology Office Note:    Date:  09/10/2022   ID:  Troy Bentley, DOB 01-27-1946, MRN [id number]  PCP:  Gordan Payment., MD  Cardiologist:  Norman Herrlich, MD    Referring MD: Gordan Payment., MD    ASSESSMENT:    1. Chronic anticoagulation   2. PFO (patent foramen ovale)   3. Mixed hyperlipidemia   4. Popliteal artery aneurysm, bilateral (HCC)   5. Femoral artery aneurysm (HCC)    PLAN:    In order of problems listed above:  With his previous stroke and pulmonary embolism remains longer anticoagulated in the context of PFO he is now taking combined aspirin anticoagulant and will start PPI protection. Check lipid profile today 1 year interval Continue vascular care through [facility]   Next appointment: 1 year   Medication Adjustments/Labs and Tests Ordered: Current medicines are reviewed at length with the patient today.  Concerns regarding medicines are outlined above.  No orders of the defined types were placed in this encounter.  Meds ordered this encounter  Medications   pantoprazole (PROTONIX) 40 MG tablet    Sig: Take 1 tablet (40 mg total) by mouth daily.    Dispense:  30 tablet    Refill:  11     History of Present Illness:    Troy Bentley is a 77 y.o. 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  He has a seen at [facility] ophthalmology for 6th nerve palsy.  Compliance with diet, lifestyle and medications: Yes  He has had intermittent vascular intervention for left popliteal aneurysm. He was initiated on aspirin I did tell him I think he should start a PPI for protection taking an anticoagulant No chest pain edema shortness of breath palpitation or syncope  No bleeding from his anticoagulant He tolerates his  statin Last lipid profile was June 2023 LDL 40 cholesterol 170 triglycerides 222 HDL 56 Past Medical History:  Diagnosis Date   Actinic keratosis 05/01/2015   Aneurysm of artery of lower extremity (HCC) 11/05/2016   Formatting of this note might be different from the original. [facility] he had aneurysmal update of this with the left being 2.5 cm and he also has 1 to the right.   Aneurysm of left femoral artery (HCC) 11/05/2016   2.5 cm left femoral artery aneurysm as well as right popliteal artery aneurysm followed at [facility] vascular surgery   Antiphospholipid syndrome (HCC) 07/08/2017   Benign neoplasm of colon 05/01/2015   Bilateral carotid artery stenosis 09/13/2018   BPH (benign prostatic hyperplasia)    Chronic anticoagulation 01/12/2018   Cognitive impairment 07/08/2017   Compression fracture of lumbar vertebra (HCC)    CVA (cerebrovascular accident) (HCC) 08/29/2016   Overview:  SUMMARY   The left ventricular size is normal.   There is mild concentric left ventricular hypertrophy.      Left ventricular systolic function is normal.   LV ejection fraction = 60-65%.      Left ventricular filling pattern is impaired relaxation.   The right ventricle is mildly dilated with normal systolic function.   The left atrial size is normal.   Injection of agitated saline sho   Diaphragmatic hernia 05/01/2015   Dysarthria 01/03/2018   Fracture of acromial end of clavicle 04/15/2012  Hemangioma 05/01/2015   Hernia, umbilical    History of compression fracture of vertebral column 10/07/2016   History of CVA (cerebrovascular accident) 08/29/2016   Overview:  Multiple bilateral cerebellar infarcts, with subacute thalamic infarct  Overview:  Overview:  SUMMARY   The left ventricular size is normal.   There is mild concentric left ventricular hypertrophy.      Left ventricular systolic function is normal.   LV ejection fraction = 60-65%.      Left ventricular filling pattern is  impaired relaxation.   The right ventricle is mildly dilated with    History of prostate cancer 03/21/2018   Formatting of this note might be different from the original. Follows with Dr. Saddie Benders for this and has FU in april   History of pulmonary embolism 11/11/2016   Hyperlipemia    Hyperlipidemia, unspecified 05/01/2015   Last Assessment & Plan:  Relevant Hx: Course: Daily Update: Today's Plan:he is fasting this am and wanted to have his labs updated back on his meds as he had been off of them when taking the lamisi for his toenail fungus  Electronically signed by: Krystal Clark, NP 05/02/15 1247   IFG (impaired fasting glucose) 05/01/2015   Last Assessment & Plan:  Relevant Hx: Course: Daily Update: Today's Plan:did let him know that acute illnesses can increase his sugar for him and he should be cognizant of this.  Electronically signed by: Krystal Clark, NP 07/19/15 1237   Ingrowing toenail of left foot 04/21/2018   Formatting of this note might be different from the original. 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   Status post placement of implantable loop recorder 10/20/2016   Stroke (HCC)    Syncope 06/13/2018   Thrombocytopenia (HCC) 05/01/2015   Thyroid nodule 05/02/2018   Varicocele 05/01/2015    Past Surgical History:  Procedure Laterality Date   BACK SURGERY  2005   EYE SURGERY     bilateral cataract surgery   LOOP RECORDER INSERTION N/A 10/08/2016   Procedure: Loop Recorder Insertion;  Surgeon: Regan Lemming, MD;  Location: [facility] INVASIVE CV LAB;  Service: Cardiovascular;  Laterality: N/A;   ORIF  CLAVICULAR FRACTURE  04/15/2012   Procedure: OPEN REDUCTION INTERNAL FIXATION (ORIF) CLAVICULAR FRACTURE;  Surgeon: Verlee Rossetti, MD;  Location: [facility] OR;  Service: Orthopedics;  Laterality: Left;   TEE WITHOUT CARDIOVERSION N/A 09/17/2016   Procedure: TRANSESOPHAGEAL ECHOCARDIOGRAM (TEE);  Surgeon: Laurey Morale, MD;  Location: [facility] ENDOSCOPY;  Service: Cardiovascular;  Laterality: N/A;    Current Medications: Current Meds  Medication Sig   Ascorbic Acid (VITAMIN C) 1000 MG tablet Take 1,000 mg by mouth daily.    aspirin EC 81 MG tablet Take 81 mg by mouth daily. Swallow whole.   B Complex Vitamins (VITAMIN B COMPLEX) TABS Take 1 tablet by mouth daily.   Coenzyme Q10 400 MG CAPS Take 1 capsule by mouth daily.   cyanocobalamin (VITAMIN B12) 1000 MCG/ML injection Inject into the muscle.   finasteride (PROSCAR) 5 MG tablet Take 5 mg by mouth daily.   fluticasone (FLONASE) 50 MCG/ACT nasal spray Place  1 spray into both nostrils daily.   furosemide (LASIX) 20 MG tablet Take 1 tablet (20 mg total) by mouth daily as needed for fluid or edema.   Multiple Vitamin (MULTIVITAMIN WITH MINERALS) TABS Take 1 tablet by mouth daily.   Multiple Vitamins-Minerals (MULTIVITAMIN) LIQD Take 20 mLs by mouth daily. Sea Aloe multivitamin liquid   NON FORMULARY Take 1 capsule by mouth 2 (two) times daily. LIFE EXTENSION PROSTATE SUPPORT   NON FORMULARY CURCUMIN WITH TUMERONES   NON FORMULARY COLLAGEN   Omega-3 Fatty Acids (OMEGA-3 FISH OIL PO) Take 2 capsules by mouth daily at 12 noon.   pantoprazole (PROTONIX) 40 MG tablet Take 1 tablet (40 mg total) by mouth daily.   polyethylene glycol (MIRALAX / GLYCOLAX) packet Take 17 g by mouth daily.    pravastatin (PRAVACHOL) 80 MG tablet Take 80 mg by mouth at bedtime.    XARELTO 20 MG TABS tablet TAKE 1 TABLET(20 MG) BY MOUTH DAILY WITH SUPPER (Patient taking differently: Take 20 mg by mouth daily with supper. TAKE 1 TABLET(20 MG) BY MOUTH DAILY WITH SUPPER)      Allergies:   Fenofibrate   EKGs/Labs/Other Studies Reviewed:    The following studies were reviewed today:  Cardiac Studies & Procedures     STRESS TESTS  MYOCARDIAL PERFUSION IMAGING 04/09/2022  Narrative   The study is normal. The study is low risk.   No ST deviation was noted.   Left ventricular function is normal. Nuclear stress EF: 57 %. The left ventricular ejection fraction is normal (55-65%). End diastolic cavity size is normal.   ECHOCARDIOGRAM  ECHOCARDIOGRAM COMPLETE 07/25/2020  Narrative ECHOCARDIOGRAM REPORT    Patient Name:   KRYSTIAN YOUNGLOVE Date of Exam: 07/25/2020 Medical Rec #:  [id number]       Height:       71.0 in Accession #:    [id number]      Weight:       249.1 lb Date of Birth:  Mar 20, 1945       BSA:          2.315 m Patient Age:    75 years        BP:           120/77 mmHg Patient Gender: M               HR:           47 bpm. Exam Location:  [address]  Procedure: 2D Echo, Cardiac Doppler and Color Doppler  Indications:    R06.02 SOB  History:        Patient has prior history of Echocardiogram examinations, most recent 09/17/2016. Stroke, Signs/Symptoms:Syncope; Risk Factors:Dyslipidemia. PFO. Bilateral carotid artery stenosis. Implantable loop recorder.  Sonographer:    Cathie Beams RCS Referring Phys: [phone] Leesa Leifheit J Shahara Hartsfield  IMPRESSIONS   1. Left ventricular ejection fraction, by estimation, is 60 to 65%. The left ventricle has normal function. The left ventricle has no regional wall motion abnormalities. There is moderate left ventricular hypertrophy. Left ventricular diastolic parameters are consistent with Grade I diastolic dysfunction (impaired relaxation). 2. Right ventricular systolic function is normal. The right ventricular size is mildly enlarged. There is normal pulmonary artery systolic pressure. The estimated right ventricular systolic pressure is 26.5 mmHg. 3. The mitral valve is normal in structure. Trivial mitral valve  regurgitation. 4. The aortic valve is tricuspid. Aortic valve regurgitation is mild. Mild aortic valve sclerosis is present, with no evidence of aortic valve stenosis. 5.  The inferior vena cava is dilated in size with >50% respiratory variability, suggesting right atrial pressure of 8 mmHg.  FINDINGS Left Ventricle: Left ventricular ejection fraction, by estimation, is 60 to 65%. The left ventricle has normal function. The left ventricle has no regional wall motion abnormalities. The left ventricular internal cavity size was normal in size. There is moderate left ventricular hypertrophy. Left ventricular diastolic parameters are consistent with Grade I diastolic dysfunction (impaired relaxation).  Right Ventricle: The right ventricular size is mildly enlarged. No increase in right ventricular wall thickness. Right ventricular systolic function is normal. There is normal pulmonary artery systolic pressure. The tricuspid regurgitant velocity is 2.15 m/s, and with an assumed right atrial pressure of 8 mmHg, the estimated right ventricular systolic pressure is 26.5 mmHg.  Left Atrium: Left atrial size was normal in size.  Right Atrium: Right atrial size was normal in size.  Pericardium: There is no evidence of pericardial effusion.  Mitral Valve: The mitral valve is normal in structure. Trivial mitral valve regurgitation.  Tricuspid Valve: The tricuspid valve is normal in structure. Tricuspid valve regurgitation is trivial.  Aortic Valve: The aortic valve is tricuspid. Aortic valve regurgitation is mild. Aortic regurgitation PHT measures 1176 msec. Mild aortic valve sclerosis is present, with no evidence of aortic valve stenosis.  Pulmonic Valve: The pulmonic valve was not well visualized. Pulmonic valve regurgitation is not visualized.  Aorta: The aortic root and ascending aorta are structurally normal, with no evidence of dilitation.  Venous: The inferior vena cava is dilated in size with  greater than 50% respiratory variability, suggesting right atrial pressure of 8 mmHg.  IAS/Shunts: No atrial level shunt detected by color flow Doppler.   LEFT VENTRICLE PLAX 2D LVIDd:         4.90 cm  Diastology LVIDs:         2.70 cm  LV e' medial:    5.70 cm/s LV PW:         1.50 cm  LV E/e' medial:  7.4 LV IVS:        1.50 cm  LV e' lateral:   4.29 cm/s LVOT diam:     2.05 cm  LV E/e' lateral: 9.9 LV SV:         69 LV SV Index:   30 LVOT Area:     3.30 cm   RIGHT VENTRICLE RV Basal diam:  3.60 cm RV S prime:     13.80 cm/s TAPSE (M-mode): 2.4 cm RVSP:           21.5 mmHg  LEFT ATRIUM             Index       RIGHT ATRIUM           Index LA diam:        4.70 cm 2.03 cm/m  RA Pressure: 3.00 mmHg LA Vol (A2C):   65.9 ml 28.47 ml/m RA Area:     19.50 cm LA Vol (A4C):   54.4 ml 23.50 ml/m RA Volume:   63.00 ml  27.22 ml/m LA Biplane Vol: 60.1 ml 25.96 ml/m AORTIC VALVE LVOT Vmax:   97.90 cm/s LVOT Vmean:  58.100 cm/s LVOT VTI:    0.209 m AI PHT:      1176 msec  AORTA Ao Root diam: 3.50 cm  MITRAL VALVE               TRICUSPID VALVE MV Area (PHT): 2.60 cm    TR Peak  grad:   18.5 mmHg MV Decel Time: 292 msec    TR Vmax:        215.00 cm/s MV E velocity: 42.40 cm/s  Estimated RAP:  3.00 mmHg MV A velocity: 57.00 cm/s  RVSP:           21.5 mmHg MV E/A ratio:  0.74 SHUNTS Systemic VTI:  0.21 m Systemic Diam: 2.05 cm  Epifanio Lesches MD Electronically signed by Epifanio Lesches MD Signature Date/Time: 07/25/2020/11:11:06 AM    Final   TEE  ECHO TEE 10/13/2016  Narrative *[place]* *[facility]* [address] [place], [place] [address] [phone]  ------------------------------------------------------------------- Transesophageal Echocardiography  Patient:    Reason, Helzer MR #:       [id number] Study Date: 09/17/2016 Gender:     M Age:        20 Height:     180.3 cm Weight:     97.1 kg BSA:        2.23 m^2 Pt.  Status: Room:  ORDERING     Norman Herrlich, MD REFERRING    Norman Herrlich, MD ADMITTING    Marca Ancona, M.D. ATTENDING    Marca Ancona, M.D. PERFORMING   Marca Ancona, M.D. SONOGRAPHER  Thurman Coyer  cc:  ------------------------------------------------------------------- LV EF: 55% -   60%  ------------------------------------------------------------------- Indications:      CVA 436.  ------------------------------------------------------------------- History:   Risk factors:  Dyslipidemia.  ------------------------------------------------------------------- Study Conclusions  - Left ventricle: The cavity size was normal. Wall thickness was increased in a pattern of mild LVH. Systolic function was normal. The estimated ejection fraction was in the range of 55% to 60%. Wall motion was normal; there were no regional wall motion abnormalities. - Aortic valve: There was no stenosis. There was mild regurgitation. - Aorta: Borderline dilated ascending aorta at 3.7 cm. Mild plaque in the descending thoracic aorta. - Mitral valve: There was mild regurgitation. - Left atrium: No evidence of thrombus in the atrial cavity or appendage. - Right ventricle: The cavity size was normal. Systolic function was normal. - Right atrium: No evidence of thrombus in the atrial cavity or appendage. - Atrial septum: There was a small PFO, bubble crossing was only noted with valsalva. There was increased thickness of the septum, consistent with lipomatous hypertrophy. - Tricuspid valve: Peak RV-RA gradient (S): 20 mm Hg. - Pericardium, extracardiac: Small pericardial effusion noted primarily adjacent to the RV.  Impressions:  - Only potential source for embolism noted was a small PFO.  ------------------------------------------------------------------- Study data:   Study status:  Routine.  Consent:  The risks, benefits, and alternatives to the procedure were explained to  the patient and informed consent was obtained.  Procedure:  The patient reported no pain pre or post test. Initial setup. The patient was brought to the laboratory. Surface ECG leads were monitored. Sedation. Conscious sedation was administered by cardiology staff. Transesophageal echocardiography. Topical anesthesia was obtained using viscous lidocaine. A transesophageal probe was inserted by the attending cardiologist. Image quality was adequate.  Study completion:  The patient tolerated the procedure well. There were no complications.  Administered medications:   Fentanyl, . Midazolam, 6mg .          Diagnostic transesophageal echocardiography.  2D and color Doppler.  Birthdate:  Patient birthdate: 04-Nov-1945.  Age:  Patient is 77 yr old.  Sex:  Gender: male.    BMI: 29.9 kg/m^2.  Blood pressure:     117/70  Patient status:  Outpatient.  Study date:  Study date: 09/17/2016. Study time: 10:07 AM.  Location:  Endoscopy.  -------------------------------------------------------------------  ------------------------------------------------------------------- Left ventricle:  The cavity size was normal. Wall thickness was increased in a pattern of mild LVH. Systolic function was normal. The estimated ejection fraction was in the range of 55% to 60%. Wall motion was normal; there were no regional wall motion abnormalities.  ------------------------------------------------------------------- Aortic valve:   Trileaflet; mildly calcified leaflets.  Doppler: There was no stenosis.   There was mild regurgitation.  ------------------------------------------------------------------- Aorta:  Borderline dilated ascending aorta at 3.7 cm. Mild plaque in the descending thoracic aorta.  ------------------------------------------------------------------- Mitral valve:   Doppler:   There was no evidence for stenosis. There was mild  regurgitation.  ------------------------------------------------------------------- Left atrium:  The atrium was normal in size.  No evidence of thrombus in the atrial cavity or appendage.  ------------------------------------------------------------------- Atrial septum:  There was a small PFO, bubble crossing was only noted with valsalva. There was increased thickness of the septum, consistent with lipomatous hypertrophy.  ------------------------------------------------------------------- Right ventricle:  The cavity size was normal. Systolic function was normal.  ------------------------------------------------------------------- Pulmonic valve:    Structurally normal valve.    Doppler:  There was trivial regurgitation.  ------------------------------------------------------------------- Tricuspid valve:   Doppler:  There was trivial regurgitation.  ------------------------------------------------------------------- Right atrium:  The atrium was normal in size.  No evidence of thrombus in the atrial cavity or appendage.  ------------------------------------------------------------------- Pericardium:  Small pericardial effusion noted primarily adjacent to the RV.  ------------------------------------------------------------------- Post procedure conclusions Ascending Aorta:  - Borderline dilated ascending aorta at 3.7 cm. Mild plaque in the descending thoracic aorta.  ------------------------------------------------------------------- Measurements  Tricuspid valve                      Value Tricuspid regurg peak velocity       224   cm/s Tricuspid peak RV-RA gradient        20    mm Hg  Legend: (L)  and  (H)  mark values outside specified reference range.  ------------------------------------------------------------------- Prepared and Electronically Authenticated by  Marca Ancona, M.D. 2018-07-31T16:06:10              Physical Exam:    VS:  BP 120/70  (BP Location: Left Arm, Patient Position: Sitting)   Pulse (!) 52   Ht 5\' 11"  (1.803 m)   Wt 253 lb 6.4 oz (114.9 kg)   SpO2 91%   BMI 35.34 kg/m     Wt Readings from Last 3 Encounters:  09/10/22 253 lb 6.4 oz (114.9 kg)  04/09/22 247 lb (112 kg)  01/26/22 247 lb 8 oz (112.3 kg)     GEN:  Well nourished, well developed in no acute distress HEENT: Normal NECK: No JVD; No carotid bruits LYMPHATICS: No lymphadenopathy CARDIAC: RRR, no murmurs, rubs, gallops RESPIRATORY:  Clear to auscultation without rales, wheezing or rhonchi  ABDOMEN: Soft, non-tender, non-distended MUSCULOSKELETAL:  No edema; No deformity  SKIN: Warm and dry NEUROLOGIC:  Alert and oriented x 3 PSYCHIATRIC:  Normal affect    Signed, Norman Herrlich, MD  09/10/2022 9:49 AM    [facility]

## 2022-09-10 ENCOUNTER — Encounter: Payer: Self-pay | Admitting: Cardiology

## 2022-09-10 ENCOUNTER — Ambulatory Visit: Payer: PPO | Attending: Cardiology | Admitting: Cardiology

## 2022-09-10 VITALS — BP 120/70 | HR 52 | Ht 71.0 in | Wt 253.4 lb

## 2022-09-10 DIAGNOSIS — I724 Aneurysm of artery of lower extremity: Secondary | ICD-10-CM | POA: Diagnosis not present

## 2022-09-10 DIAGNOSIS — E782 Mixed hyperlipidemia: Secondary | ICD-10-CM

## 2022-09-10 DIAGNOSIS — Q2112 Patent foramen ovale: Secondary | ICD-10-CM

## 2022-09-10 DIAGNOSIS — Z7901 Long term (current) use of anticoagulants: Secondary | ICD-10-CM | POA: Diagnosis not present

## 2022-09-10 MED ORDER — PANTOPRAZOLE SODIUM 40 MG PO TBEC: 40.0000 mg | DELAYED_RELEASE_TABLET | Freq: Every day | ORAL | 11 refills | Status: DC

## 2022-09-10 NOTE — Patient Instructions (Signed)
Medication Instructions:  Your physician has recommended you make the following change in your medication:   START: Protonix 40 mg daily  *If you need a refill on your cardiac medications before your next appointment, please call your pharmacy*   Lab Work: Your physician recommends that you return for lab work in:   Labs today: Lipid  If you have labs (blood work) drawn today and your tests are completely normal, you will receive your results only by: MyChart Message (if you have MyChart) OR A paper copy in the mail If you have any lab test that is abnormal or we need to change your treatment, we will call you to review the results.   Testing/Procedures: None   Follow-Up: At [facility], you and your health needs are our priority.  As part of our continuing mission to provide you with exceptional heart care, we have created designated Provider Care Teams.  These Care Teams include your primary Cardiologist (physician) and Advanced Practice Providers (APPs -  Physician Assistants and Nurse Practitioners) who all work together to provide you with the care you need, when you need it.  We recommend signing up for the patient portal called "MyChart".  Sign up information is provided on this After Visit Summary.  MyChart is used to connect with patients for Virtual Visits (Telemedicine).  Patients are able to view lab/test results, encounter notes, upcoming appointments, etc.  Non-urgent messages can be sent to your provider as well.   To learn more about what you can do with MyChart, go to [email or web address].    Your next appointment:   1 year(s)  Provider:   Norman Herrlich, MD    Other Instructions None

## 2022-11-11 ENCOUNTER — Other Ambulatory Visit: Payer: Self-pay | Admitting: Cardiology

## 2022-11-11 DIAGNOSIS — Z86711 Personal history of pulmonary embolism: Secondary | ICD-10-CM

## 2022-11-11 NOTE — Telephone Encounter (Signed)
Prescription refill request for Xarelto received.  Indication: Stroke/PE Last office visit: 09/10/22 Dulce Sellar)  Weight: 114.9kg Age: 77 Scr: 1.16 (09/14/22)  CrCl: 86.7ml/min  Appropriate dose. Refill sent.

## 2022-12-31 ENCOUNTER — Other Ambulatory Visit: Payer: Self-pay | Admitting: Cardiology

## 2023-01-01 NOTE — Telephone Encounter (Signed)
Rx refill sent to pharmacy. 

## 2023-01-26 NOTE — Progress Notes (Unsigned)
[facility]  [address] [place],  [place]  [address] [phone]  Clinic Day:  11/24/2021  Referring physician: Gordan Payment., MD   HISTORY OF PRESENT ILLNESS:  The patient is a 77 y.o. male  who I recently began seeing for thrombocytopenia.  However, at his initial visit, his platelet count was actually normal at 139.  He comes in today to reassess his thrombocytopenia.  Since his last visit, the patient has been doing well.  He continues to deny having any bruising/bleeding issues which concern him for worsening thrombocytopenia.  PHYSICAL EXAM:  There were no vitals taken for this visit. Wt Readings from Last 3 Encounters:  09/10/22 253 lb 6.4 oz (114.9 kg)  04/09/22 247 lb (112 kg)  01/26/22 247 lb 8 oz (112.3 kg)   There is no height or weight on file to calculate BMI. Performance status (ECOG): 1 - Symptomatic but completely ambulatory Physical Exam Constitutional:      Appearance: Normal appearance. He is not ill-appearing.  HENT:     Mouth/Throat:     Mouth: Mucous membranes are moist.     Pharynx: Oropharynx is clear. No oropharyngeal exudate or posterior oropharyngeal erythema.  Cardiovascular:     Rate and Rhythm: Regular rhythm. Bradycardia present.     Heart sounds: No murmur heard.    No friction rub. No gallop.  Pulmonary:     Effort: Pulmonary effort is normal. No respiratory distress.     Breath sounds: Normal breath sounds. No wheezing, rhonchi or rales.  Abdominal:     General: Bowel sounds are normal. There is no distension.     Palpations: Abdomen is soft. There is no mass.     Tenderness: There is no abdominal tenderness.  Musculoskeletal:        General: No swelling.     Right lower leg: No edema.     Left lower leg: No edema.  Lymphadenopathy:     Cervical: No cervical adenopathy.     Upper Body:     Right upper body: No supraclavicular or axillary adenopathy.     Left upper body: No supraclavicular or  axillary adenopathy.     Lower Body: No right inguinal adenopathy. No left inguinal adenopathy.  Skin:    General: Skin is warm.     Coloration: Skin is not jaundiced.     Findings: No lesion or rash.  Neurological:     General: No focal deficit present.     Mental Status: He is alert and oriented to person, place, and time. Mental status is at baseline.  Psychiatric:        Mood and Affect: Mood normal.        Behavior: Behavior normal.        Thought Content: Thought content normal.    LABS:     Latest Reference Range & Units 11/24/21 14:11  Iron 45 - 182 ug/dL 295  UIBC ug/dL 621  TIBC 308 - 657 ug/dL 846  Saturation Ratios 17.9 - 39.5 % 32  Ferritin 24 - 336 ng/mL 136  Folate >5.9 ng/mL 21.9  Vitamin B12 180 - 914 pg/mL 669    Latest Reference Range & Units 11/24/21 14:11  TSH 0.350 - 4.500 uIU/mL 1.163    ASSESSMENT & PLAN:  A 77 y.o. male who I recently began seeing for thrombocytopenia.  I am pleased as his platelets remain the exact same as they were a few months ago; his platelets remain  at low normal levels.  He remains only minimally leukopenic.  However, he does have more than enough white cells to fight off spontaneous infections.  Of note, recent labs did not show him to have any nutritional deficiencies or underlying thyroid disease being present.  From a hematologic standpoint, this gentleman is stable.  As that is the case, I will see him back in 1 year for repeat clinical assessment. The patient understands all the plans discussed today and is in agreement with them.  Yasmin Bronaugh Kirby Funk, MD

## 2023-01-27 ENCOUNTER — Inpatient Hospital Stay: Payer: PPO

## 2023-01-27 ENCOUNTER — Telehealth: Payer: Self-pay | Admitting: Oncology

## 2023-01-27 ENCOUNTER — Other Ambulatory Visit: Payer: Self-pay | Admitting: Oncology

## 2023-01-27 ENCOUNTER — Inpatient Hospital Stay: Payer: PPO | Attending: Oncology | Admitting: Oncology

## 2023-01-27 VITALS — BP 149/80 | HR 53 | Temp 97.8°F | Resp 16 | Ht 71.0 in | Wt 257.5 lb

## 2023-01-27 DIAGNOSIS — D72819 Decreased white blood cell count, unspecified: Secondary | ICD-10-CM | POA: Diagnosis not present

## 2023-01-27 DIAGNOSIS — D696 Thrombocytopenia, unspecified: Secondary | ICD-10-CM

## 2023-01-27 LAB — CMP (CANCER CENTER ONLY)
ALT: 42 U/L (ref 0–44)
AST: 38 U/L (ref 15–41)
Albumin: 4.2 g/dL (ref 3.5–5.0)
Alkaline Phosphatase: 81 U/L (ref 38–126)
Anion gap: 10 (ref 5–15)
BUN: 17 mg/dL (ref 8–23)
CO2: 28 mmol/L (ref 22–32)
Calcium: 9.3 mg/dL (ref 8.9–10.3)
Chloride: 101 mmol/L (ref 98–111)
Creatinine: 1.23 mg/dL (ref 0.61–1.24)
GFR, Estimated: >60 mL/min (ref >60)
Glucose, Bld: 180 mg/dL — ABNORMAL HIGH (ref 70–99)
Potassium: 4.2 mmol/L (ref 3.5–5.1)
Sodium: 139 mmol/L (ref 135–145)
Total Bilirubin: 1.3 mg/dL — ABNORMAL HIGH (ref <1.2)
Total Protein: 7.2 g/dL (ref 6.5–8.1)

## 2023-01-27 LAB — CBC WITH DIFFERENTIAL (CANCER CENTER ONLY)
Abs Immature Granulocytes: 0.01 10*3/uL (ref 0.00–0.07)
Basophils Absolute: 0 10*3/uL (ref 0.0–0.1)
Basophils Relative: 0 %
Eosinophils Absolute: 0.1 10*3/uL (ref 0.0–0.5)
Eosinophils Relative: 3 %
HCT: 45.2 % (ref 39.0–52.0)
Hemoglobin: 15.4 g/dL (ref 13.0–17.0)
Immature Granulocytes: 0 %
Lymphocytes Relative: 20 %
Lymphs Abs: 0.7 10*3/uL (ref 0.7–4.0)
MCH: 33.8 pg (ref 26.0–34.0)
MCHC: 34.1 g/dL (ref 30.0–36.0)
MCV: 99.1 fL (ref 80.0–100.0)
Monocytes Absolute: 0.5 10*3/uL (ref 0.1–1.0)
Monocytes Relative: 12 %
Neutro Abs: 2.4 10*3/uL (ref 1.7–7.7)
Neutrophils Relative %: 65 %
Platelet Count: 151 10*3/uL (ref 150–400)
RBC: 4.56 MIL/uL (ref 4.22–5.81)
RDW: 14.1 % (ref 11.5–15.5)
WBC Count: 3.7 10*3/uL — ABNORMAL LOW (ref 4.0–10.5)
nRBC: 0 % (ref 0.0–0.2)
nRBC: 0 /100{WBCs}

## 2023-01-27 NOTE — Progress Notes (Signed)
[facility]  [address] [place],  [place]  [address] [phone]  Clinic Day:  01/27/2023  Referring physician: Gordan Payment., MD   HISTORY OF PRESENT ILLNESS:  The patient is a 77 y.o. male with mild thrombocytopenia.  However, at his initial visit, his platelet count was actually normal at 139.  He comes in today to reassess his thrombocytopenia.  Since his last visit, the patient has been doing well.  He continues to deny having any bruising/bleeding issues which concern him for worsening thrombocytopenia being present.   PHYSICAL EXAM:  Blood pressure (!) 149/80, pulse (!) 53, temperature 97.8 F (36.6 C), resp. rate 16, height 5\' 11"  (1.803 m), weight 257 lb 8 oz (116.8 kg), SpO2 96%. Wt Readings from Last 3 Encounters:  01/27/23 257 lb 8 oz (116.8 kg)  09/10/22 253 lb 6.4 oz (114.9 kg)  04/09/22 247 lb (112 kg)   Body mass index is 35.91 kg/m. Performance status (ECOG): 1 - Symptomatic but completely ambulatory Physical Exam Constitutional:      Appearance: Normal appearance. He is not ill-appearing.  HENT:     Mouth/Throat:     Mouth: Mucous membranes are moist.     Pharynx: Oropharynx is clear. No oropharyngeal exudate or posterior oropharyngeal erythema.  Cardiovascular:     Rate and Rhythm: Normal rate and regular rhythm.     Heart sounds: No murmur heard.    No friction rub. No gallop.  Pulmonary:     Effort: Pulmonary effort is normal. No respiratory distress.     Breath sounds: Normal breath sounds. No wheezing, rhonchi or rales.  Abdominal:     General: Bowel sounds are normal. There is no distension.     Palpations: Abdomen is soft. There is no mass.     Tenderness: There is no abdominal tenderness.  Musculoskeletal:        General: No swelling.     Right lower leg: No edema.     Left lower leg: No edema.  Lymphadenopathy:     Cervical: No cervical adenopathy.     Upper Body:     Right upper body: No  supraclavicular or axillary adenopathy.     Left upper body: No supraclavicular or axillary adenopathy.     Lower Body: No right inguinal adenopathy. No left inguinal adenopathy.  Skin:    General: Skin is warm.     Coloration: Skin is not jaundiced.     Findings: No lesion or rash.  Neurological:     General: No focal deficit present.     Mental Status: He is alert and oriented to person, place, and time. Mental status is at baseline.  Psychiatric:        Mood and Affect: Mood normal.        Behavior: Behavior normal.        Thought Content: Thought content normal.    LABS:      Latest Ref Rng & Units 01/27/2023    8:56 AM 11/24/2021   12:00 AM 04/15/2012   10:31 AM  CBC  WBC 4.0 - 10.5 K/uL 3.7  3.0     5.3   Hemoglobin 13.0 - 17.0 g/dL 83.1  51.7     61.6   Hematocrit 39.0 - 52.0 % 45.2  44     42.9   Platelets 150 - 400 K/uL 151  139     135      This result is from an external  source.      Latest Ref Rng & Units 01/27/2023    8:56 AM 01/26/2022   10:43 AM 11/24/2021   12:00 AM  CMP  Glucose 70 - 99 mg/dL 474  259    BUN 8 - 23 mg/dL 17  23  19       Creatinine 0.61 - 1.24 mg/dL 5.63  8.75  1.0      Sodium 135 - 145 mmol/L 139  141  137      Potassium 3.5 - 5.1 mmol/L 4.2  4.3  4.3      Chloride 98 - 111 mmol/L 101  102  102      CO2 22 - 32 mmol/L 28  29  29       Calcium 8.9 - 10.3 mg/dL 9.3  9.1  9.0      Total Protein 6.5 - 8.1 g/dL 7.2  7.3    Total Bilirubin <1.2 mg/dL 1.3  1.5    Alkaline Phos 38 - 126 U/L 81  59  68      AST 15 - 41 U/L 38  35  49      ALT 0 - 44 U/L 42  43  51         This result is from an external source.   ASSESSMENT & PLAN:   Assessment/Plan:  A 77 y.o. male with mild thrombocytopenia.  I am pleased as his platelets are even better than what they have been previously.  He remains only minimally leukopenic, with his white count also being better than previously.  From a hematologic standpoint, this gentleman is stable.  As that is the  case, I will see him back in 1 year for repeat clinical assessment. The patient understands all the plans discussed today and is in agreement with them.     Mima Cranmore Kirby Funk, MD

## 2023-01-27 NOTE — Telephone Encounter (Signed)
01/27/23 Spoke with patient and confirmed next appt.

## 2023-06-18 ENCOUNTER — Other Ambulatory Visit: Payer: Self-pay | Admitting: Cardiology

## 2023-06-18 DIAGNOSIS — Z86711 Personal history of pulmonary embolism: Secondary | ICD-10-CM

## 2023-06-18 NOTE — Telephone Encounter (Signed)
 Prescription refill request for Xarelto received.  Indication:pfo Last office visit:6/24 Weight:116.8  kg Age:78 Scr:1.06  1/25 CrCl:94.88  ml/min  Prescription refilled

## 2023-09-07 ENCOUNTER — Other Ambulatory Visit: Payer: Self-pay | Admitting: Cardiology

## 2023-09-18 ENCOUNTER — Other Ambulatory Visit: Payer: Self-pay | Admitting: Cardiology

## 2023-09-20 NOTE — Progress Notes (Signed)
 Cardiology Office Note   Date:  09/22/2023  ID:  Troy Bentley, DOB 1945/05/25, MRN [id number] PCP: Thurmond Cathlyn LABOR., MD  [facility] Providers Cardiologist:  Redell Leiter, MD Cardiology APP:  Carlin Delon BROCKS, NP     History of Present Illness Troy Bentley is a 78 y.o. male with a past medical history of stroke, PFO, history of PE, dyslipidemia, PVD, femoral popliteal aneurysm followed by [facility] at [facility].  04/09/2022 Lexiscan  normal, low risk 07/25/2020 echo  EF 60 to 65%, moderate LVH, grade 1 DD, trivial MR mild aortic valve sclerosis without stenosis and mild regurgitation 09/25/2016 echo EF 55 to 60%, mild MR, small PFO  He initially established with [facility] in 2021 for loop recorder implantation following a stroke.  He established with Dr. Leiter in 2022 for the evaluation of shortness of breath.  Echocardiogram at that time revealed normal EF with moderate LVH.  He had a stress evaluation in 2024 which was a normal, low risk study.  Patient recently was evaluated by Dr. Leiter on 09/10/2022, we had vascular intervention for left popliteal aneurysm by vascular services at [facility], from a cardiac perspective he appeared to be stable tolerating all of his medications without any adverse side effects, he was started on pantoprazole  for GI protection and was advised to follow-up in 1 year.  Patient presents today accompanied by his wife for follow-up.  From a cardiac perspective he has been doing relatively well since he was last evaluated in our office.  He does try to participate in some exercising when able however recently had eye surgery and has not been able to exercise.  His heart rate today is 46, he has hypercardia for many years however is typically not been in the 40 range.  He is overall asymptomatic but does endorse some fatigue. He denies chest pain, palpitations, dyspnea, pnd, orthopnea, n, v, dizziness, syncope, edema, weight gain, or early satiety.    ROS: Review of  Systems  Constitutional:  Positive for malaise/fatigue.  All other systems reviewed and are negative.    Studies Reviewed EKG Interpretation Date/Time:  Wednesday September 22 2023 09:10:52 EDT Ventricular Rate:  43 PR Interval:  176 QRS Duration:  98 QT Interval:  432 QTC Calculation: 365 R Axis:   16  Text Interpretation: Marked sinus bradycardia with sinus arrhythmia Nonspecific T wave abnormality When compared with ECG of 15-Apr-2012 10:39, Vent. rate has decreased BY  21 BPM Confirmed by Carlin Delon [phone]) on 09/22/2023 9:17:03 AM    Cardiac Studies & Procedures   ______________________________________________________________________________________________   STRESS TESTS  MYOCARDIAL PERFUSION IMAGING 04/09/2022  Interpretation Summary   The study is normal. The study is low risk.   No ST deviation was noted.   Left ventricular function is normal. Nuclear stress EF: 57 %. The left ventricular ejection fraction is normal (55-65%). End diastolic cavity size is normal.   ECHOCARDIOGRAM  ECHOCARDIOGRAM COMPLETE 07/25/2020  Narrative ECHOCARDIOGRAM REPORT    Patient Name:   Troy Bentley Date of Exam: 07/25/2020 Medical Rec #:  [id number]       Height:       71.0 in Accession #:    [id number]      Weight:       249.1 lb Date of Birth:  10-May-1945       BSA:          2.315 m Patient Age:    75 years        BP:  120/77 mmHg Patient Gender: M               HR:           47 bpm. Exam Location:  [address]  Procedure: 2D Echo, Cardiac Doppler and Color Doppler  Indications:    R06.02 SOB  History:        Patient has prior history of Echocardiogram examinations, most recent 09/17/2016. Stroke, Signs/Symptoms:Syncope; Risk Factors:Dyslipidemia. PFO. Bilateral carotid artery stenosis. Implantable loop recorder.  Sonographer:    Jon Hacker RCS Referring Phys: [id number] BRIAN J MUNLEY  IMPRESSIONS   1. Left ventricular ejection fraction, by estimation, is 60  to 65%. The left ventricle has normal function. The left ventricle has no regional wall motion abnormalities. There is moderate left ventricular hypertrophy. Left ventricular diastolic parameters are consistent with Grade I diastolic dysfunction (impaired relaxation). 2. Right ventricular systolic function is normal. The right ventricular size is mildly enlarged. There is normal pulmonary artery systolic pressure. The estimated right ventricular systolic pressure is 26.5 mmHg. 3. The mitral valve is normal in structure. Trivial mitral valve regurgitation. 4. The aortic valve is tricuspid. Aortic valve regurgitation is mild. Mild aortic valve sclerosis is present, with no evidence of aortic valve stenosis. 5. The inferior vena cava is dilated in size with >50% respiratory variability, suggesting right atrial pressure of 8 mmHg.  FINDINGS Left Ventricle: Left ventricular ejection fraction, by estimation, is 60 to 65%. The left ventricle has normal function. The left ventricle has no regional wall motion abnormalities. The left ventricular internal cavity size was normal in size. There is moderate left ventricular hypertrophy. Left ventricular diastolic parameters are consistent with Grade I diastolic dysfunction (impaired relaxation).  Right Ventricle: The right ventricular size is mildly enlarged. No increase in right ventricular wall thickness. Right ventricular systolic function is normal. There is normal pulmonary artery systolic pressure. The tricuspid regurgitant velocity is 2.15 m/s, and with an assumed right atrial pressure of 8 mmHg, the estimated right ventricular systolic pressure is 26.5 mmHg.  Left Atrium: Left atrial size was normal in size.  Right Atrium: Right atrial size was normal in size.  Pericardium: There is no evidence of pericardial effusion.  Mitral Valve: The mitral valve is normal in structure. Trivial mitral valve regurgitation.  Tricuspid Valve: The tricuspid valve is  normal in structure. Tricuspid valve regurgitation is trivial.  Aortic Valve: The aortic valve is tricuspid. Aortic valve regurgitation is mild. Aortic regurgitation PHT measures 1176 msec. Mild aortic valve sclerosis is present, with no evidence of aortic valve stenosis.  Pulmonic Valve: The pulmonic valve was not well visualized. Pulmonic valve regurgitation is not visualized.  Aorta: The aortic root and ascending aorta are structurally normal, with no evidence of dilitation.  Venous: The inferior vena cava is dilated in size with greater than 50% respiratory variability, suggesting right atrial pressure of 8 mmHg.  IAS/Shunts: No atrial level shunt detected by color flow Doppler.   LEFT VENTRICLE PLAX 2D LVIDd:         4.90 cm  Diastology LVIDs:         2.70 cm  LV e' medial:    5.70 cm/s LV PW:         1.50 cm  LV E/e' medial:  7.4 LV IVS:        1.50 cm  LV e' lateral:   4.29 cm/s LVOT diam:     2.05 cm  LV E/e' lateral: 9.9 LV SV:  69 LV SV Index:   30 LVOT Area:     3.30 cm   RIGHT VENTRICLE RV Basal diam:  3.60 cm RV S prime:     13.80 cm/s TAPSE (M-mode): 2.4 cm RVSP:           21.5 mmHg  LEFT ATRIUM             Index       RIGHT ATRIUM           Index LA diam:        4.70 cm 2.03 cm/m  RA Pressure: 3.00 mmHg LA Vol (A2C):   65.9 ml 28.47 ml/m RA Area:     19.50 cm LA Vol (A4C):   54.4 ml 23.50 ml/m RA Volume:   63.00 ml  27.22 ml/m LA Biplane Vol: 60.1 ml 25.96 ml/m AORTIC VALVE LVOT Vmax:   97.90 cm/s LVOT Vmean:  58.100 cm/s LVOT VTI:    0.209 m AI PHT:      1176 msec  AORTA Ao Root diam: 3.50 cm  MITRAL VALVE               TRICUSPID VALVE MV Area (PHT): 2.60 cm    TR Peak grad:   18.5 mmHg MV Decel Time: 292 msec    TR Vmax:        215.00 cm/s MV E velocity: 42.40 cm/s  Estimated RAP:  3.00 mmHg MV A velocity: 57.00 cm/s  RVSP:           21.5 mmHg MV E/A ratio:  0.74 SHUNTS Systemic VTI:  0.21 m Systemic Diam: 2.05 cm  Lonni Nanas MD Electronically signed by Lonni Nanas MD Signature Date/Time: 07/25/2020/11:11:06 AM    Final   TEE  ECHO TEE 09/17/2016  Narrative *[place]* *[facility]* [address] [place], [place] [address] [phone]  ------------------------------------------------------------------- Transesophageal Echocardiography  Patient:    Fenton, Candee MR #:       [id number] Study Date: 09/17/2016 Gender:     M Age:        78 Height:     180.3 cm Weight:     97.1 kg BSA:        2.23 m^2 Pt. Status: Room:  ORDERING     Redell Leiter, MD REFERRING    Redell Leiter, MD ADMITTING    Ezra Shuck, M.D. ATTENDING    Ezra Shuck, M.D. PERFORMING   Ezra Shuck, M.D. SONOGRAPHER  Augustin Seals  cc:  ------------------------------------------------------------------- LV EF: 55% -   60%  ------------------------------------------------------------------- Indications:      CVA 436.  ------------------------------------------------------------------- History:   Risk factors:  Dyslipidemia.  ------------------------------------------------------------------- Study Conclusions  - Left ventricle: The cavity size was normal. Wall thickness was increased in a pattern of mild LVH. Systolic function was normal. The estimated ejection fraction was in the range of 55% to 60%. Wall motion was normal; there were no regional wall motion abnormalities. - Aortic valve: There was no stenosis. There was mild regurgitation. - Aorta: Borderline dilated ascending aorta at 3.7 cm. Mild plaque in the descending thoracic aorta. - Mitral valve: There was mild regurgitation. - Left atrium: No evidence of thrombus in the atrial cavity or appendage. - Right ventricle: The cavity size was normal. Systolic function was normal. - Right atrium: No evidence of thrombus in the atrial cavity or appendage. - Atrial septum: There was a small PFO, bubble crossing  was only noted with valsalva. There was increased thickness  of the septum, consistent with lipomatous hypertrophy. - Tricuspid valve: Peak RV-RA gradient (S): 20 mm Hg. - Pericardium, extracardiac: Small pericardial effusion noted primarily adjacent to the RV.  Impressions:  - Only potential source for embolism noted was a small PFO.  ------------------------------------------------------------------- Study data:   Study status:  Routine.  Consent:  The risks, benefits, and alternatives to the procedure were explained to the patient and informed consent was obtained.  Procedure:  The patient reported no pain pre or post test. Initial setup. The patient was brought to the laboratory. Surface ECG leads were monitored. Sedation. Conscious sedation was administered by cardiology staff. Transesophageal echocardiography. Topical anesthesia was obtained using viscous lidocaine . A transesophageal probe was inserted by the attending cardiologist. Image quality was adequate.  Study completion:  The patient tolerated the procedure well. There were no complications.  Administered medications:   Fentanyl , 50mcg. Midazolam , 6mg .          Diagnostic transesophageal echocardiography.  2D and color Doppler.  Birthdate:  Patient birthdate: 06/08/1945.  Age:  Patient is 78 yr old.  Sex:  Gender: male.    BMI: 29.9 kg/m^2.  Blood pressure:     117/70  Patient status:  Outpatient.  Study date:  Study date: 09/17/2016. Study time: 10:07 AM.  Location:  Endoscopy.  -------------------------------------------------------------------  ------------------------------------------------------------------- Left ventricle:  The cavity size was normal. Wall thickness was increased in a pattern of mild LVH. Systolic function was normal. The estimated ejection fraction was in the range of 55% to 60%. Wall motion was normal; there were no regional wall  motion abnormalities.  ------------------------------------------------------------------- Aortic valve:   Trileaflet; mildly calcified leaflets.  Doppler: There was no stenosis.   There was mild regurgitation.  ------------------------------------------------------------------- Aorta:  Borderline dilated ascending aorta at 3.7 cm. Mild plaque in the descending thoracic aorta.  ------------------------------------------------------------------- Mitral valve:   Doppler:   There was no evidence for stenosis. There was mild regurgitation.  ------------------------------------------------------------------- Left atrium:  The atrium was normal in size.  No evidence of thrombus in the atrial cavity or appendage.  ------------------------------------------------------------------- Atrial septum:  There was a small PFO, bubble crossing was only noted with valsalva. There was increased thickness of the septum, consistent with lipomatous hypertrophy.  ------------------------------------------------------------------- Right ventricle:  The cavity size was normal. Systolic function was normal.  ------------------------------------------------------------------- Pulmonic valve:    Structurally normal valve.    Doppler:  There was trivial regurgitation.  ------------------------------------------------------------------- Tricuspid valve:   Doppler:  There was trivial regurgitation.  ------------------------------------------------------------------- Right atrium:  The atrium was normal in size.  No evidence of thrombus in the atrial cavity or appendage.  ------------------------------------------------------------------- Pericardium:  Small pericardial effusion noted primarily adjacent to the RV.  ------------------------------------------------------------------- Post procedure conclusions Ascending Aorta:  - Borderline dilated ascending aorta at 3.7 cm. Mild plaque in  the descending thoracic aorta.  ------------------------------------------------------------------- Measurements  Tricuspid valve                      Value Tricuspid regurg peak velocity       224   cm/s Tricuspid peak RV-RA gradient        20    mm Hg  Legend: (L)  and  (H)  mark values outside specified reference range.  ------------------------------------------------------------------- Prepared and Electronically Authenticated by  Ezra Shuck, M.D. 2018-07-31T16:06:10        ______________________________________________________________________________________________      Risk Assessment/Calculations           Physical Exam VS:  BP 121/67 (BP Location: Left Arm, Patient Position: Sitting, Cuff Size: Large)   Pulse (!) 46   Ht 5' 11 (1.803 m)   Wt 261 lb 9.6 oz (118.7 kg)   BMI 36.49 kg/m        Wt Readings from Last 3 Encounters:  09/22/23 261 lb 9.6 oz (118.7 kg)  01/27/23 257 lb 8 oz (116.8 kg)  09/10/22 253 lb 6.4 oz (114.9 kg)    GEN: Well nourished, well developed in no acute distress NECK: No JVD; No carotid bruits CARDIAC: RRR, no murmurs, rubs, gallops RESPIRATORY:  Clear to auscultation without rales, wheezing or rhonchi  ABDOMEN: Soft, non-tender, non-distended EXTREMITIES:  No edema; No deformity   ASSESSMENT AND PLAN Chronic anticoagulation with history of PE and patent PFO and history of stroke-currently on Xarelto  indefinitely, not entirely clear he manages this however he will see his PCP next month and they typically do routine blood work.  However based on his most recent blood work it appears he is on appropriate dose of Xarelto  and tolerating without hematochezia, hematuria, hemoptysis.  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  He does have fatigue but I do not think this is related to his bradycardia however I do think we need to arrange for a monitor just to  evaluate chronotropic competence but he is recovering from eye surgery and his wife would like to hold off for now, she will notify our office when he is cleared from his ophthalmologist and we will arrange for 3-day monitor.  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  Dyslipidemia-followed by his PCP, currently on Pravachol 80 mg daily.       Dispo: Follow up in 1 year. His wife will notify our office once he is cleared by his surgeon and we will arrange for a three day monitor.  Signed, Delon JAYSON Hoover, NP

## 2023-09-22 ENCOUNTER — Ambulatory Visit: Attending: Cardiology | Admitting: Cardiology

## 2023-09-22 VITALS — BP 121/67 | HR 46 | Ht 71.0 in | Wt 261.6 lb

## 2023-09-22 DIAGNOSIS — Z7901 Long term (current) use of anticoagulants: Secondary | ICD-10-CM | POA: Diagnosis not present

## 2023-09-22 DIAGNOSIS — Q2112 Patent foramen ovale: Secondary | ICD-10-CM

## 2023-09-22 DIAGNOSIS — E782 Mixed hyperlipidemia: Secondary | ICD-10-CM

## 2023-09-22 DIAGNOSIS — R001 Bradycardia, unspecified: Secondary | ICD-10-CM

## 2023-09-22 DIAGNOSIS — Z8781 Personal history of (healed) traumatic fracture: Secondary | ICD-10-CM

## 2023-09-22 DIAGNOSIS — Z8673 Personal history of transient ischemic attack (TIA), and cerebral infarction without residual deficits: Secondary | ICD-10-CM

## 2023-09-22 NOTE — Patient Instructions (Signed)
 Medication Instructions:  Your physician recommends that you continue on your current medications as directed. Please refer to the Current Medication list given to you today.  *If you need a refill on your cardiac medications before your next appointment, please call your pharmacy*  Lab Work: NONE  If you have labs (blood work) drawn today and your tests are completely normal, you will receive your results only by: MyChart Message (if you have MyChart) OR A paper copy in the mail If you have any lab test that is abnormal or we need to change your treatment, we will call you to review the results.  Testing/Procedures: NONE  Follow-Up: At [facility], you and your health needs are our priority.  As part of our continuing mission to provide you with exceptional heart care, our providers are all part of one team.  This team includes your primary Cardiologist (physician) and Advanced Practice Providers or APPs (Physician Assistants and Nurse Practitioners) who all work together to provide you with the care you need, when you need it.  Your next appointment:   1 year(s)  Provider:   Redell Leiter, MD

## 2023-10-07 ENCOUNTER — Other Ambulatory Visit: Payer: Self-pay | Admitting: Cardiology

## 2023-10-07 DIAGNOSIS — Z86711 Personal history of pulmonary embolism: Secondary | ICD-10-CM

## 2023-10-07 NOTE — Telephone Encounter (Signed)
 Prescription refill request for Xarelto  received.  Indication: PE Last office visit: 09/22/23 Troy Bentley)  Weight: 118.7kg Age: 78 Scr: 1.06 (03/24/23)  CrCl: 96.37ml/min  Appropriate dose. Refill sent.

## 2023-12-18 ENCOUNTER — Other Ambulatory Visit: Payer: Self-pay | Admitting: Cardiology

## 2024-01-26 NOTE — Progress Notes (Signed)
 [facility] [address] [place],  [place]  [address] [phone]  Clinic Day:  01/27/2024  Referring physician: Thurmond Cathlyn LABOR., MD   HISTORY OF PRESENT ILLNESS:  The patient is a 78 y.o. male with mild thrombocytopenia.  Over the past few years, this gentleman's platelet count has never been severely low to where intervention has been necessary.  He comes in today to reassess his thrombocytopenia.  Since his last visit, the patient has been doing well.  He continues to deny having any significant bruising/bleeding issues which concern him for worsening thrombocytopenia being present.   PHYSICAL EXAM:  Blood pressure 121/72, pulse (!) 41, temperature 97.7 F (36.5 C), temperature source Oral, resp. rate 16, height 5' 11 (1.803 m), weight 245 lb 1.6 oz (111.2 kg), SpO2 97%. Wt Readings from Last 3 Encounters:  01/27/24 245 lb 1.6 oz (111.2 kg)  09/22/23 261 lb 9.6 oz (118.7 kg)  01/27/23 257 lb 8 oz (116.8 kg)   Body mass index is 34.18 kg/m. Performance status (ECOG): 1 - Symptomatic but completely ambulatory Physical Exam Constitutional:      Appearance: Normal appearance. He is not ill-appearing.  HENT:     Mouth/Throat:     Mouth: Mucous membranes are moist.     Pharynx: Oropharynx is clear. No oropharyngeal exudate or posterior oropharyngeal erythema.  Cardiovascular:     Rate and Rhythm: Normal rate and regular rhythm.     Heart sounds: No murmur heard.    No friction rub. No gallop.  Pulmonary:     Effort: Pulmonary effort is normal. No respiratory distress.     Breath sounds: Normal breath sounds. No wheezing, rhonchi or rales.  Abdominal:     General: Bowel sounds are normal. There is no distension.     Palpations: Abdomen is soft. There is no mass.     Tenderness: There is no abdominal tenderness.  Musculoskeletal:        General: No swelling.     Right lower leg: No edema.     Left lower leg: No edema.  Lymphadenopathy:      Cervical: No cervical adenopathy.     Upper Body:     Right upper body: No supraclavicular or axillary adenopathy.     Left upper body: No supraclavicular or axillary adenopathy.     Lower Body: No right inguinal adenopathy. No left inguinal adenopathy.  Skin:    General: Skin is warm.     Coloration: Skin is not jaundiced.     Findings: No lesion or rash.  Neurological:     General: No focal deficit present.     Mental Status: He is alert and oriented to person, place, and time. Mental status is at baseline.  Psychiatric:        Mood and Affect: Mood normal.        Behavior: Behavior normal.        Thought Content: Thought content normal.    LABS:      Latest Ref Rng & Units 01/27/2024    8:35 AM 01/27/2023    8:56 AM 11/24/2021   12:00 AM  CBC  WBC 4.0 - 10.5 K/uL 3.6  3.7  3.0      Hemoglobin 13.0 - 17.0 g/dL 84.9  84.5  84.8      Hematocrit 39.0 - 52.0 % 44.0  45.2  44      Platelets 150 - 400 K/uL 142  151  139  This result is from an external source.    Latest Reference Range & Units 01/27/24 08:35  Neutrophils % 60  Lymphocytes % 22  Monocytes Relative % 16  Eosinophil % 2  Basophil % 0  Immature Granulocytes % 0  NEUT# 1.7 - 7.7 K/uL 2.1      Latest Ref Rng & Units 01/27/2024    8:35 AM 01/27/2023    8:56 AM 01/26/2022   10:43 AM  CMP  Glucose 70 - 99 mg/dL 880  819  876   BUN 8 - 23 mg/dL 17  17  23    Creatinine 0.61 - 1.24 mg/dL 8.86  8.76  8.96   Sodium 135 - 145 mmol/L 139  139  141   Potassium 3.5 - 5.1 mmol/L 4.4  4.2  4.3   Chloride 98 - 111 mmol/L 102  101  102   CO2 22 - 32 mmol/L 27  28  29    Calcium 8.9 - 10.3 mg/dL 8.9  9.3  9.1   Total Protein 6.5 - 8.1 g/dL 7.0  7.2  7.3   Total Bilirubin 0.0 - 1.2 mg/dL 1.8  1.3  1.5   Alkaline Phos 38 - 126 U/L 70  81  59   AST 15 - 41 U/L 38  38  35   ALT 0 - 44 U/L 38  42  43    ASSESSMENT & PLAN:  Assessment/Plan:  A 78 y.o. male with mild thrombocytopenia.  I am pleased as his platelets  are essentially unchanged versus what they have been previously.  He remains only minimally leukopenic, but still with enough neutrophils to stave off any spontaneous infection.  From a hematologic standpoint, this gentleman is stable.  As that is the case, I do feel comfortable seeing him back in 1 year for repeat clinical assessment. The patient understands all the plans discussed today and is in agreement with them.   Dustyn Dansereau DELENA Kerns, MD

## 2024-01-27 ENCOUNTER — Inpatient Hospital Stay: Payer: PPO | Attending: Oncology | Admitting: Oncology

## 2024-01-27 ENCOUNTER — Telehealth: Payer: Self-pay | Admitting: Oncology

## 2024-01-27 ENCOUNTER — Other Ambulatory Visit: Payer: Self-pay | Admitting: Oncology

## 2024-01-27 ENCOUNTER — Inpatient Hospital Stay: Payer: PPO

## 2024-01-27 VITALS — BP 121/72 | HR 41 | Temp 97.7°F | Resp 16 | Ht 71.0 in | Wt 245.1 lb

## 2024-01-27 DIAGNOSIS — D696 Thrombocytopenia, unspecified: Secondary | ICD-10-CM

## 2024-01-27 LAB — CMP (CANCER CENTER ONLY)
ALT: 38 U/L (ref 0–44)
AST: 38 U/L (ref 15–41)
Albumin: 4 g/dL (ref 3.5–5.0)
Alkaline Phosphatase: 70 U/L (ref 38–126)
Anion gap: 10 (ref 5–15)
BUN: 17 mg/dL (ref 8–23)
CO2: 27 mmol/L (ref 22–32)
Calcium: 8.9 mg/dL (ref 8.9–10.3)
Chloride: 102 mmol/L (ref 98–111)
Creatinine: 1.13 mg/dL (ref 0.61–1.24)
GFR, Estimated: >60 mL/min (ref >60)
Glucose, Bld: 119 mg/dL — ABNORMAL HIGH (ref 70–99)
Potassium: 4.4 mmol/L (ref 3.5–5.1)
Sodium: 139 mmol/L (ref 135–145)
Total Bilirubin: 1.8 mg/dL — ABNORMAL HIGH (ref 0.0–1.2)
Total Protein: 7 g/dL (ref 6.5–8.1)

## 2024-01-27 LAB — CBC WITH DIFFERENTIAL (CANCER CENTER ONLY)
Abs Immature Granulocytes: 0.01 K/uL (ref 0.00–0.07)
Basophils Absolute: 0 K/uL (ref 0.0–0.1)
Basophils Relative: 0 %
Eosinophils Absolute: 0.1 K/uL (ref 0.0–0.5)
Eosinophils Relative: 2 %
HCT: 44 % (ref 39.0–52.0)
Hemoglobin: 15 g/dL (ref 13.0–17.0)
Immature Granulocytes: 0 %
Lymphocytes Relative: 22 %
Lymphs Abs: 0.8 K/uL (ref 0.7–4.0)
MCH: 33.9 pg (ref 26.0–34.0)
MCHC: 34.1 g/dL (ref 30.0–36.0)
MCV: 99.5 fL (ref 80.0–100.0)
Monocytes Absolute: 0.6 K/uL (ref 0.1–1.0)
Monocytes Relative: 16 %
Neutro Abs: 2.1 K/uL (ref 1.7–7.7)
Neutrophils Relative %: 60 %
Platelet Count: 142 K/uL — ABNORMAL LOW (ref 150–400)
RBC: 4.42 MIL/uL (ref 4.22–5.81)
RDW: 13.1 % (ref 11.5–15.5)
WBC Count: 3.6 K/uL — ABNORMAL LOW (ref 4.0–10.5)
nRBC: 0 % (ref 0.0–0.2)

## 2024-01-27 NOTE — Telephone Encounter (Signed)
 Patient has been scheduled for follow-up visit per 01/27/24 LOS.  Pt aware of scheduled appt details.

## 2024-01-27 NOTE — Progress Notes (Unsigned)
  [employer]  Edit Multiple  Estimate

## 2025-01-26 ENCOUNTER — Inpatient Hospital Stay

## 2025-01-26 ENCOUNTER — Inpatient Hospital Stay: Admitting: Oncology
